# Patient Record
Sex: Male | Born: 1990 | Race: Black or African American | Hispanic: No | Marital: Single | Smoking: Current every day smoker
Health system: Southern US, Community
[De-identification: ages and names within clinical notes are randomized; demographics above are authoritative.]

## PROBLEM LIST (undated history)

## (undated) DIAGNOSIS — Z789 Other specified health status: Secondary | ICD-10-CM

## (undated) HISTORY — PX: OTHER SURGICAL HISTORY: SHX169

## (undated) HISTORY — DX: Other specified health status: Z78.9

---
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
## 2011-11-06 ENCOUNTER — Emergency Department (INDEPENDENT_AMBULATORY_CARE_PROVIDER_SITE_OTHER)
Admission: EM | Admit: 2011-11-06 | Discharge: 2011-11-06 | Disposition: A | Discharge status: Home / Self Care | Payer: Self-pay | Source: Home / Self Care

## 2011-11-06 ENCOUNTER — Encounter (HOSPITAL_COMMUNITY): Payer: Self-pay

## 2011-11-06 DIAGNOSIS — Z202 Contact with and (suspected) exposure to infections with a predominantly sexual mode of transmission: Secondary | ICD-10-CM

## 2011-11-06 DIAGNOSIS — Z2089 Contact with and (suspected) exposure to other communicable diseases: Secondary | ICD-10-CM

## 2011-11-06 MED ORDER — AZITHROMYCIN 250 MG PO TABS
1000.0000 mg | ORAL_TABLET | Freq: Every day | ORAL | Status: DC
  Administered 2011-11-06 (×2): 1000 mg via ORAL

## 2011-11-06 MED ORDER — AZITHROMYCIN 250 MG PO TABS
ORAL_TABLET | ORAL | Status: AC
  Filled 2011-11-06: qty 4

## 2011-11-06 NOTE — ED Provider Notes (Signed)
History     CSN: [id number]  Arrival date & time 11/06/11  1107   None     Chief Complaint  Patient presents with  . Exposure to STD    (Consider location/radiation/quality/duration/timing/severity/associated sxs/prior treatment) HPI Comments: Was told by sexual partner that she had been tested positive for chlamydia. He is here for testing and treatment. This otherwise healthy male  has no associated complaints. Denies urinary or other GU sx's.   The history is provided by the patient.    History reviewed. No pertinent past medical history.  History reviewed. No pertinent past surgical history.  History reviewed. No pertinent family history.  History  Substance Use Topics  . Smoking status: Current Everyday Smoker  . Smokeless tobacco: Not on file  . Alcohol Use: Yes      Review of Systems  Constitutional: Positive for activity change. Negative for chills.  Respiratory: Negative for cough and shortness of breath.   Genitourinary: Negative.   Musculoskeletal: Negative.   Skin: Negative.     Allergies  Review of patient's allergies indicates no known allergies.  Home Medications  No current outpatient prescriptions on file.  BP 134/78  Pulse 74  Temp 99.1 F (37.3 C) (Oral)  Resp 16  SpO2 100%  Physical Exam  Constitutional: He is oriented to person, place, and time. He appears well-developed and well-nourished.  Pulmonary/Chest: Effort normal. No respiratory distress.  Genitourinary: Penis normal. No penile tenderness.  Neurological: He is alert and oriented to person, place, and time.  Skin: Skin is warm and dry.  Psychiatric: He has a normal mood and affect.    ED Course  Procedures (including critical care time)   Labs Reviewed  GC/CHLAMYDIA PROBE AMP, GENITAL   No results found.   1. Exposure to STD       MDM  Asymptomatic male with chlamydia exposurant tx with azithro 1gm po        Hayden Rasmussen, NP 11/06/11 1312  Hayden Rasmussen, NP 11/06/11 1313  Hayden Rasmussen, NP 11/06/11 1316

## 2011-11-06 NOTE — ED Notes (Signed)
States his pregnant girlfriend was told she has chlamydia; here for testing and treatment (she reportedly has a RX called in from provider)

## 2011-11-08 NOTE — ED Provider Notes (Signed)
Medical screening examination/treatment/procedure(s) were performed by non-physician practitioner and as supervising physician I was immediately available for consultation/collaboration.  Brya Simerly   Aeriana Speece, MD 11/08/11 1605 

## 2011-11-10 LAB — GC/CHLAMYDIA PROBE AMP, GENITAL
Chlamydia, DNA Probe: POSITIVE — AB
GC Probe Amp, Genital: NEGATIVE

## 2011-11-18 ENCOUNTER — Telehealth (HOSPITAL_COMMUNITY): Payer: Self-pay | Admitting: *Deleted

## 2011-11-18 NOTE — ED Notes (Signed)
GC neg., Chlamydia pos. Pt. adequately treated with Zithromax. I called pt.  Pt. verified x 2 and given results.  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  Pt. instructed to notify their partner, no sex for 1 week and to practice safe sex. Pt. told he can get HIV testing at the [facility]. STD clinic by appointment.  DHHS form completed and faxed to the [facility]. Vassie Moselle 11/18/2011

## 2013-07-08 ENCOUNTER — Emergency Department (HOSPITAL_COMMUNITY)
Admission: EM | Admit: 2013-07-08 | Discharge: 2013-07-08 | Disposition: A | Discharge status: Home / Self Care | Payer: Self-pay | Attending: Emergency Medicine | Admitting: Emergency Medicine

## 2013-07-08 ENCOUNTER — Encounter (HOSPITAL_COMMUNITY): Payer: Self-pay | Admitting: Emergency Medicine

## 2013-07-08 DIAGNOSIS — F17200 Nicotine dependence, unspecified, uncomplicated: Secondary | ICD-10-CM | POA: Insufficient documentation

## 2013-07-08 DIAGNOSIS — K029 Dental caries, unspecified: Secondary | ICD-10-CM | POA: Insufficient documentation

## 2013-07-08 DIAGNOSIS — K089 Disorder of teeth and supporting structures, unspecified: Secondary | ICD-10-CM | POA: Insufficient documentation

## 2013-07-08 MED ORDER — AMOXICILLIN 500 MG PO CAPS
500.0000 mg | ORAL_CAPSULE | Freq: Once | ORAL | Status: AC
  Administered 2013-07-08: 500 mg via ORAL
  Filled 2013-07-08: qty 1

## 2013-07-08 MED ORDER — AMOXICILLIN 500 MG PO CAPS
500.0000 mg | ORAL_CAPSULE | Freq: Three times a day (TID) | ORAL | Status: DC
Start: 2013-07-08 — End: 2019-04-14

## 2013-07-08 MED ORDER — HYDROCODONE-ACETAMINOPHEN 5-325 MG PO TABS
1.0000 | ORAL_TABLET | Freq: Once | ORAL | Status: AC
  Administered 2013-07-08: 1 via ORAL
  Filled 2013-07-08: qty 1

## 2013-07-08 MED ORDER — HYDROCODONE-ACETAMINOPHEN 5-325 MG PO TABS: ORAL_TABLET | ORAL | Status: DC

## 2013-07-08 NOTE — Discharge Instructions (Signed)
Take vicodin for breakthrough pain, do not drink alcohol, drive, care for children or do other critical tasks while taking vicodin. ° °Return to the emergency room for fever, change in vision, redness to the face that rapidly spreads towards the eye, nausea or vomiting, difficulty swallowing or shortness of breath. °  °Apply warm compresses to jaw throughout the day.  ° °Take your antibiotics as directed and to the end of the course.  ° °Followup with a dentist is very important for ongoing evaluation and management of recurrent dental pain. Return to emergency department for emergent changing or worsening symptoms." ° °Low-cost dental clinic: °**David  Civils  at [phone]**  °**Janna Civils at [phone] [address]**   ° °You may also call [phone] ° °Dental Assistance °If the dentist on-call cannot see you, please use the resources below: ° ° °Patients with Medicaid: [facility] °[address], [phone] °[address], [phone] ° °If unable to pay, or uninsured, contact [facility] ([phone]) or [facility] ([phone] in , [phone] in [place]) to become qualified for the adult dental clinic ° °Other Low-Cost Community Dental Services: °[facility]- [address], [place], [place], [address] °   [phone], Ext. [phone] °   2nd and 4th Thursday of the month at 6:30am °   10 clients each day by appointment, can sometimes see walk-in     patients if someone does not show for an appointment °[facility]- [address], [place], [place], [address] °   [phone] °[facility]- [address], [place], [place], [address] °   [phone] ° °[facility]- [phone] °[facility]- [phone] °[facility]- [phone] ° °

## 2013-07-08 NOTE — ED Provider Notes (Signed)
CSN: [id number]     Arrival date & time 07/08/13  1640 History  This chart was scribed for non-physician practitioner, Wynetta EmeryNicole Andreka Stucki, PA-C, working with Gwyneth SproutWhitney Plunkett, MD by Shari HeritageAisha Amuda, ED Scribe. This patient was seen in room TR08C/TR08C and the patient's care was started at 5:41 PM.   Chief Complaint  Patient presents with  . Dental Pain    The history is provided by the patient. No language interpreter was used.    HPI Comments: William Porter is a 23 y.o. male who presents to the Emergency Department complaining of intermittent, throbbing left lower dental pain that radiates to the rest of his left face onset 3 days ago. He also reports that one of his right lower back teeth fractured while eating today. Patient denies associated shortness of breath, dysphagia, or fever. He has taken ibuprofen and BC Goody Powder without relief. He does not have a dentist. Patient has no chronic medical conditions. He is a current smoker.  History reviewed. No pertinent past medical history. History reviewed. No pertinent past surgical history. No family history on file. History  Substance Use Topics  . Smoking status: Current Every Day Smoker  . Smokeless tobacco: Not on file  . Alcohol Use: Yes    Review of Systems  Constitutional: Negative for fever.  HENT: Positive for dental problem. Negative for trouble swallowing.   Respiratory: Negative for shortness of breath.   Cardiovascular: Negative for chest pain.  Gastrointestinal: Negative for nausea, vomiting, abdominal pain and diarrhea.  All other systems reviewed and are negative.   Allergies  Review of patient's allergies indicates no known allergies.  Home Medications   Prior to Admission medications   Not on File   Triage Vitals: BP 156/109  Pulse 62  Temp(Src) 98.7 F (37.1 C) (Oral)  Resp 18  Ht 6\' 2"  (1.88 m)  Wt 211 lb 3 oz (95.794 kg)  BMI 27.10 kg/m2  SpO2 100% Physical Exam  Nursing note and vitals  reviewed. Constitutional: He is oriented to person, place, and time. He appears well-developed and well-nourished. No distress.  HENT:  Head: Normocephalic.  Mouth/Throat: Dental caries present. No dental abscesses.    No tenderness to palpation underneath tongue or with tongue elevation. No gross dental abscess.   Eyes: Conjunctivae and EOM are normal.  Cardiovascular: Normal rate.   Pulmonary/Chest: Effort normal. No stridor.  Musculoskeletal: Normal range of motion.  Neurological: He is alert and oriented to person, place, and time.  Psychiatric: He has a normal mood and affect.    ED Course  Procedures (including critical care time) DIAGNOSTIC STUDIES: Oxygen Saturation is 100% on room air, normal by my interpretation.    COORDINATION OF CARE: 5:43 PM- Patient with toothache. No gross abscess. Exam unconcerning for Ludwig's angina or spread of infection. Will treat with antibiotics and pain medicine. Urged patient to follow-up with dentist. Patient informed of current plan for treatment and evaluation and agrees with plan at this time.    MDM   Final diagnoses:  Pain due to dental caries    Filed Vitals:   07/08/13 1647  BP: 156/109  Pulse: 62  Temp: 98.7 F (37.1 C)  TempSrc: Oral  Resp: 18  Height: 6\' 2"  (1.88 m)  Weight: 211 lb 3 oz (95.794 kg)  SpO2: 100%    Medications  amoxicillin (AMOXIL) capsule 500 mg (500 mg Oral Given 07/08/13 1800)  HYDROcodone-acetaminophen (NORCO/VICODIN) 5-325 MG per tablet 1 tablet (1 tablet Oral Given 07/08/13 1759)  William Porter is a 23 y.o. male presenting with dental pain associated with dental cary but no signs or symptoms of dental abscess. Patient afebrile, non toxic appearing and swallowing secretions well. I gave patient referral to dentist and stressed the importance of dental follow up for ultimate management of dental pain. Patient voices understanding and is agreeable to plan.  Evaluation does not show  pathology that would require ongoing emergent intervention or inpatient treatment. Pt is hemodynamically stable and mentating appropriately. Discussed findings and plan with patient/guardian, who agrees with care plan. All questions answered. Return precautions discussed and outpatient follow up given.   Discharge Medication List as of 07/08/2013  5:49 PM    START taking these medications   Details  amoxicillin (AMOXIL) 500 MG capsule Take 1 capsule (500 mg total) by mouth 3 (three) times daily., Starting 07/08/2013, Until Discontinued, Print    HYDROcodone-acetaminophen (NORCO/VICODIN) 5-325 MG per tablet Take 1-2 tablets by mouth every 6 hours as needed for pain., Print        Note: Portions of this report may have been transcribed using voice recognition software. Every effort was made to ensure accuracy; however, inadvertent computerized transcription errors may be present   I personally performed the services described in this documentation, which was scribed in my presence. The recorded information has been reviewed and is accurate.   Wynetta Emeryicole Zehava Turski, PA-C 07/09/13 [phone]

## 2013-07-08 NOTE — ED Notes (Signed)
Pt c/o left lower sided toothache ongoing since 2006. Pain increased 2-3 days ago. Pt has tried advil, ibuprofen, BC goody powder without relief.

## 2013-07-09 NOTE — ED Provider Notes (Signed)
Medical screening examination/treatment/procedure(s) were performed by non-physician practitioner and as supervising physician I was immediately available for consultation/collaboration.   EKG Interpretation None        Arneshia Ade, MD 07/09/13 2327 

## 2013-10-24 ENCOUNTER — Encounter (HOSPITAL_COMMUNITY): Payer: Self-pay | Admitting: Emergency Medicine

## 2013-10-24 ENCOUNTER — Emergency Department (HOSPITAL_COMMUNITY)
Admission: EM | Admit: 2013-10-24 | Discharge: 2013-10-24 | Disposition: A | Discharge status: Home / Self Care | Payer: Self-pay | Attending: Emergency Medicine | Admitting: Emergency Medicine

## 2013-10-24 DIAGNOSIS — K029 Dental caries, unspecified: Secondary | ICD-10-CM | POA: Insufficient documentation

## 2013-10-24 DIAGNOSIS — K089 Disorder of teeth and supporting structures, unspecified: Secondary | ICD-10-CM | POA: Insufficient documentation

## 2013-10-24 DIAGNOSIS — F17200 Nicotine dependence, unspecified, uncomplicated: Secondary | ICD-10-CM | POA: Insufficient documentation

## 2013-10-24 DIAGNOSIS — Z792 Long term (current) use of antibiotics: Secondary | ICD-10-CM | POA: Insufficient documentation

## 2013-10-24 MED ORDER — PENICILLIN V POTASSIUM 500 MG PO TABS: 500.0000 mg | ORAL_TABLET | Freq: Three times a day (TID) | ORAL | Status: DC

## 2013-10-24 MED ORDER — HYDROCODONE-ACETAMINOPHEN 5-325 MG PO TABS: 1.0000 | ORAL_TABLET | Freq: Four times a day (QID) | ORAL | Status: DC | PRN

## 2013-10-24 NOTE — ED Provider Notes (Signed)
CSN: [id number]     Arrival date & time 10/24/13  1747 History  This chart was scribed for non-physician practitioner, Felicie Mornavid Esdras Delair, NP working with Richardean Canalavid H Yao, MD by Greggory StallionKayla Andersen, ED scribe. This patient was seen in room TR07C/TR07C and the patient's care was started at 6:39 PM.   Chief Complaint  Patient presents with  . Dental Pain   The history is provided by the patient. No language interpreter was used.   HPI Comments: William Porter is a 23 y.o. male who presents to the Emergency Department complaining of sudden onset right lower dental pain that started earlier today. States his tooth has been fractured for a few weeks but when he was eating a chip earlier, it caused the tooth to move. Closing his mouth worsens the pain.   History reviewed. No pertinent past medical history. History reviewed. No pertinent past surgical history. History reviewed. No pertinent family history. History  Substance Use Topics  . Smoking status: Current Every Day Smoker  . Smokeless tobacco: Not on file  . Alcohol Use: Yes    Review of Systems  HENT: Positive for dental problem.   All other systems reviewed and are negative.  Allergies  Review of patient's allergies indicates no known allergies.  Home Medications   Prior to Admission medications   Medication Sig Start Date End Date Taking? Authorizing Provider  amoxicillin (AMOXIL) 500 MG capsule Take 1 capsule (500 mg total) by mouth 3 (three) times daily. 07/08/13   Nicole Pisciotta, PA-C  HYDROcodone-acetaminophen (NORCO/VICODIN) 5-325 MG per tablet Take 1-2 tablets by mouth every 6 hours as needed for pain. 07/08/13   Nicole Pisciotta, PA-C   BP 126/41  Pulse 66  Temp(Src) 98.4 F (36.9 C) (Oral)  Resp 20  Ht 6\' 2"  (1.88 m)  Wt 210 lb (95.255 kg)  BMI 26.95 kg/m2  SpO2 100%  Physical Exam  Nursing note and vitals reviewed. Constitutional: He is oriented to person, place, and time. He appears well-developed and well-nourished.  No distress.  HENT:  Head: Normocephalic and atraumatic.  Fragment of tooth #29 barely attached. Fractured.   Eyes: Conjunctivae and EOM are normal.  Neck: Neck supple. No tracheal deviation present.  Cardiovascular: Normal rate, regular rhythm and normal heart sounds.   Pulmonary/Chest: Effort normal and breath sounds normal. No respiratory distress. He has no wheezes. He has no rhonchi. He has no rales.  Musculoskeletal: Normal range of motion.  Neurological: He is alert and oriented to person, place, and time.  Skin: Skin is warm and dry.  Psychiatric: He has a normal mood and affect. His behavior is normal.    ED Course  Procedures (including critical care time)  DIAGNOSTIC STUDIES: Oxygen Saturation is 100% on RA, normal by my interpretation.    COORDINATION OF CARE: 6:42 PM-Discussed treatment plan which includes an antibiotic, an anti-inflammatory and pain medication with pt at bedside and pt agreed to plan. Will give pt dental referrals and advised him to follow up.   Labs Review Labs Reviewed - No data to display  Imaging Review No results found.   EKG Interpretation None      MDM   Final diagnoses:  None    Dental pain, fractured tooth..  Antibiotic, analgesia, dental resource information provided.  I personally performed the services described in this documentation, which was scribed in my presence. The recorded information has been reviewed and is accurate.  Jimmye Normanavid John Wai Minotti, NP 10/25/13 [phone]

## 2013-10-24 NOTE — Discharge Instructions (Signed)
Dental Pain °A tooth ache may be caused by cavities (tooth decay). Cavities expose the nerve of the tooth to air and hot or cold temperatures. It may come from an infection or abscess (also called a boil or furuncle) around your tooth. It is also often caused by dental caries (tooth decay). This causes the pain you are having. °DIAGNOSIS  °Your caregiver can diagnose this problem by exam. °TREATMENT  °· If caused by an infection, it may be treated with medications which kill germs (antibiotics) and pain medications as prescribed by your caregiver. Take medications as directed. °· Only take over-the-counter or prescription medicines for pain, discomfort, or fever as directed by your caregiver. °· Whether the tooth ache today is caused by infection or dental disease, you should see your dentist as soon as possible for further care. °SEEK MEDICAL CARE IF: °The exam and treatment you received today has been provided on an emergency basis only. This is not a substitute for complete medical or dental care. If your problem worsens or new problems (symptoms) appear, and you are unable to meet with your dentist, call or return to this location. °SEEK IMMEDIATE MEDICAL CARE IF:  °· You have a fever. °· You develop redness and swelling of your face, jaw, or neck. °· You are unable to open your mouth. °· You have severe pain uncontrolled by pain medicine. °MAKE SURE YOU:  °· Understand these instructions. °· Will watch your condition. °· Will get help right away if you are not doing well or get worse. °Document Released: 02/23/2005 Document Revised: 05/18/2011 Document Reviewed: 10/12/2007 °ExitCare® Patient Information ©2015 [employer]. This information is not intended to replace advice given to you by your health care provider. Make sure you discuss any questions you have with your health care provider. ° °Emergency Department Resource Guide °1) Find a Doctor and Pay Out of Pocket °Although you won't have to find out who  is covered by your insurance plan, it is a good idea to ask around and get recommendations. You will then need to call the office and see if the doctor you have chosen will accept you as a new patient and what types of options they offer for patients who are self-pay. Some doctors offer discounts or will set up payment plans for their patients who do not have insurance, but you will need to ask so you aren't surprised when you get to your appointment. ° °2) Contact Your Local Health Department °Not all health departments have doctors that can see patients for sick visits, but many do, so it is worth a call to see if yours does. If you don't know where your local health department is, you can check in your phone book. The CDC also has a tool to help you locate your state's health department, and many state websites also have listings of all of their local health departments. ° °3) Find a Walk-in Clinic °If your illness is not likely to be very severe or complicated, you may want to try a walk in clinic. These are popping up all over the country in pharmacies, drugstores, and shopping centers. They're usually staffed by nurse practitioners or physician assistants that have been trained to treat common illnesses and complaints. They're usually fairly quick and inexpensive. However, if you have serious medical issues or chronic medical problems, these are probably not your best option. ° °No Primary Care Doctor: °- Call [facility] at  832-8000 - they can help you locate a primary   care doctor that  accepts your insurance, provides certain services, etc. °- Physician Referral Service- 1-800-533-3463 ° °Chronic Pain Problems: °Organization         Address  Phone   Notes  °[facility]  (336) 297-2271 Patients need to be referred by their primary care doctor.  ° °Medication Assistance: °Organization         Address  Phone   Notes  °[place] [facility] [address]  [address] °[place], [place] [address] (336) 641-8030 --Must be a resident of [place] °-- Must have NO insurance coverage whatsoever (no Medicaid/ Medicare, etc.) °-- The pt. MUST have a primary care doctor that directs their care regularly and follows them in the community °  °[facility]  (866) 331-1348   °[employer]  (888) 892-1162   ° °Agencies that provide inexpensive medical care: °Organization         Address  Phone   Notes  °[facility]  (336) 832-8035   °[facility]    (336) 832-7272   °[facility] [address] °[place], [place] [address] (336) 832-4777   °[facility] [address], °[place] (336) 271-4999   °[facility]    (336) 373-0678   °[facility]    (336) 272-1050   °[facility] ° [address], [place] Phone:  (336) 832-4444, Fax:  (336) 832-4440 Hours of Operation:  9 am - 6 pm, M-F.  Also accepts Medicaid/Medicare and self-pay.  °[facility] ° [address], [place] Phone: (336) 832-3150, Fax: (336) 832-3151. Hours of Operation:  8:30 am - 5:30 pm, M-F.  Also accepts Medicaid and self-pay.  °[facility] [place] [address], [place] Phone: (336) 878-6027   °[facility] [address], [place], [place] (336)723-1848, Ext. 123 Mondays & Thursdays: 7-9 AM.  First 15 patients are seen on a first come, first serve basis. °  ° °Medicaid-accepting [place] Providers: ° °Organization         Address  Phone   Notes  °[facility] [address], [place] (336) 641-2100 Also accepts self-pay patients.  °[facility] [address], [place] ° (336) 856-9996   °[facility] [address], [place] (336) 288-8857   °[facility] [address] [place] [address], [place] (336) 299-7000   °Veita Bland [address], [place]  ° (336) 373-1557 Only accepts [place] Access Medicaid patients after they have their name applied to their card.  ° °Self-Pay (no insurance) in [place]: ° °Organization         Address  Phone   Notes  °Sickle Cell Patients, [facility] [address], [place] (336) 832-1970   °[facility] [address], [place] (336) 832-4400   °[facility] ° [address],  (336) 992-4800   °[facility]/Dr. Osei-Bonsu ° [address] [place] [address], [place] or [address], [place] (336) 841-8500 Phone number for both [place] and [place] locations is the same.  °[facility] [address], [place] (336) 299-0000   °[facility] [address] [place] [address], [place] or [address] (336) 852-7530 °(336) 878-2260   °  [facility] [address], [place] (336) 350-1642, phone; (336) 294-5005, fax Sees patients 1st and 3rd Saturday of every month.  Must not qualify for public or private insurance (i.e. Medicaid, Medicare, [place] Health Choice, Veterans' Benefits) • Household income should be no more than 200% of the poverty level •The clinic cannot treat you if you are pregnant or think you are pregnant • Sexually transmitted diseases are not treated at the clinic.  ° ° °Dental Care: °Organization         Address  Phone  Notes  °[facility] [facility] [address], [place] (336) 641-6152 Accepts children up to age 21 who are enrolled in Medicaid or [place] Health Choice; pregnant women with a Medicaid card; and children who have applied for Medicaid or [place] Health Choice, but were declined, whose parents can pay a reduced fee at time of service.  °[facility] [place]  [address], [place] (336) 641-7733 Accepts children up to age 21 who are  enrolled in Medicaid or [place] Health Choice; pregnant women with a Medicaid card; and children who have applied for Medicaid or [place] Health Choice, but were declined, whose parents can pay a reduced fee at time of service.  °[facility] ° [address], [place] (336) 641-4533 Patients are seen by appointment only. Walk-ins are not accepted. [facility] will see patients 18 years of age and older. °Monday - Tuesday (8am-5pm) °Most Wednesdays (8:30-5pm) °$30 per visit, cash only  °[facility] ° [address], [place] (336) 641-4533 Patients are seen by appointment only. Walk-ins are not accepted. [facility] will see patients 18 years of age and older. °One Wednesday Evening (Monthly: Volunteer Based).  $30 per visit, cash only  °[facility]  (919) 537-3737 for adults; Children under age 4, call Graduate Pediatric Dentistry at ([phone]) [phone]. Children aged 4-14, please call ([phone]) [phone] to request a pediatric application. ° Dental services are provided in all areas of dental care including fillings, crowns and bridges, complete and partial dentures, implants, gum treatment, root canals, and extractions. Preventive care is also provided. Treatment is provided to both adults and children. °Patients are selected via a lottery and there is often a waiting list. °  °[facility] [address], °[place] ° (336) 763-8833 [email or web address] °  °[facility] [address], [place], [place] (336)723-1848, Ext. 123 Second and Fourth Thursday of each month, opens at 6:30 AM; Clinic ends at 9 AM.  Patients are seen on a first-come first-served basis, and a limited number are seen during each clinic.  ° °[facility] ° [address], [place], [place] (336) 723-7904   Eligibility Requirements °You must have lived in [place], [place], or [place] counties for at least the last three months. °  You  cannot be eligible for state or federal sponsored healthcare insurance, including Veterans Administration, Medicaid, or Medicare. °  You generally cannot be eligible for healthcare insurance through your employer.  °  How to apply: °Eligibility screenings are held every Tuesday and Wednesday afternoon from 1:00 pm until 4:00 pm. You do not need an appointment for the interview!  °[facility] [address], [place], [place] 336-631-2330   °[facility] [facility]  336-342-8273   °[facility]  336-703-3100   °[facility]  336-570-6415   ° °Behavioral Health Resources in the Community: °Intensive Outpatient Programs °Organization         Address  Phone  Notes  °[facility] [address], [place], [place] 336-878-6098   °[facility] [address], [place], [place] 336-832-9800   °[facility] [address], [place], [place] ° 336-882-2125   °[facility] [address],  °[place], [place] 1-800-853-5163 or 336-641-4981   °Substance Abuse Resources °Organization         Address  Phone  Notes  °[facility]  336-882-2125   °[facility]  336-784-9470   °[facility]  336-285-9073   °[facility]  336-845-3988   °Residential & Outpatient Substance Abuse Program  1-800-659-3381   °Psychological Services °Organization         Address  Phone  Notes  °[facility]  336- 832-9600   °[facility]  336- 378-7881   °[facility] [address], [place] 1-800-853-5163 or 336-641-4981   ° °Mobile Crisis Teams °Organization         Address  Phone  Notes  °[facility], Mobile Crisis Care Unit  1-877-626-1772   °Assertive °[facility] ° [address] [place], [place] 336-834-9664   °Sharon DeEsch [address] °[place] 336-554-5454   ° °Self-Help/Support  Groups °Organization         Address  Phone             Notes  °[facility] - variety of support groups  336- 373-1402 Call for more information  °Narcotics Anonymous (NA), [facility] [address], °[place]  2 meetings at this location  ° °Residential Treatment Programs °Organization         Address  Phone  Notes  °[facility] [address],    °[place]  1-866-801-8205   °[facility] ° [address], [place], [place] 704-293-8524   °[facility] [address], [place] 336-845-3988 Admissions: 8am-3pm M-F  °[facility] [address]    °[place], [place] 336-841-1104   °[facility] [address], [place], [place] 336-379-7146   °[facility] [address]  °[place], [place] 336-285-9073   °[facility] - Intensive Outpatient [address], [place], [place] 336-852-3033   °[facility] [address]  °[place], [place] 1-877-615-2722 or 336-784-9470   °[facility] [address] [place], [place] 336-227-7417 Accepts Medicaid  °[facility] [address]  ° [place] 1-800-659-3381 Substance Abuse/Addiction Treatment  ° °[place] Behavioral Health Resources °Organization         Address  Phone  Notes  °[facility]  (888) 581-9988   °Julie Brannon, PhD [address] [place], [place]   (336) 349-5553 or (336) 951-0000   °[facility]   [address] °[place], [place] (336) 349-4454   °[facility] [address], [place], [place] (336) 342-8316 Insurance/Medicaid/sponsorship through [facility]  °[facility] [address]                                    [place],  (336) 342-8316 Therapy/tele-psych/case  °[facility]   [address]  ° [place], [place] (336) 349-2233    °Dr. Arfeen  (336) 349-4544   °[facility] [place]  [employer] [place] [facility] 1) [address], [place] °2) [address], [place] °3)  [address], [place] (336) 349-3220 °(336) 342-7768 ° °(336) 342-8140   °[place] [facility] (336) 342-1394 or (336) 342-3537 (After Hours)    ° ° ° °

## 2013-10-24 NOTE — ED Notes (Signed)
Pt in c/o right lower toothache that started after eating a chip, states the tooth ws already cracked and the chip moved it

## 2013-10-24 NOTE — ED Notes (Signed)
Patient refused wheelchair. 

## 2013-10-25 NOTE — ED Provider Notes (Signed)
Medical screening examination/treatment/procedure(s) were performed by non-physician practitioner and as supervising physician I was immediately available for consultation/collaboration.   EKG Interpretation None        Richardean Canalavid H Yao, MD 10/25/13 1048

## 2014-03-31 ENCOUNTER — Encounter (HOSPITAL_COMMUNITY): Payer: Self-pay | Admitting: Emergency Medicine

## 2014-03-31 ENCOUNTER — Emergency Department (HOSPITAL_COMMUNITY)
Admission: EM | Admit: 2014-03-31 | Discharge: 2014-03-31 | Disposition: A | Discharge status: Home / Self Care | Payer: Self-pay | Attending: Emergency Medicine | Admitting: Emergency Medicine

## 2014-03-31 DIAGNOSIS — K0889 Other specified disorders of teeth and supporting structures: Secondary | ICD-10-CM

## 2014-03-31 DIAGNOSIS — Z720 Tobacco use: Secondary | ICD-10-CM | POA: Insufficient documentation

## 2014-03-31 DIAGNOSIS — K002 Abnormalities of size and form of teeth: Secondary | ICD-10-CM | POA: Insufficient documentation

## 2014-03-31 DIAGNOSIS — K047 Periapical abscess without sinus: Secondary | ICD-10-CM | POA: Insufficient documentation

## 2014-03-31 DIAGNOSIS — Z792 Long term (current) use of antibiotics: Secondary | ICD-10-CM | POA: Insufficient documentation

## 2014-03-31 DIAGNOSIS — K029 Dental caries, unspecified: Secondary | ICD-10-CM | POA: Insufficient documentation

## 2014-03-31 MED ORDER — HYDROCODONE-ACETAMINOPHEN 5-325 MG PO TABS: 1.0000 | ORAL_TABLET | ORAL | Status: DC | PRN

## 2014-03-31 MED ORDER — PENICILLIN V POTASSIUM 500 MG PO TABS: 500.0000 mg | ORAL_TABLET | Freq: Four times a day (QID) | ORAL | Status: DC

## 2014-03-31 MED ORDER — HYDROCODONE-ACETAMINOPHEN 5-325 MG PO TABS
1.0000 | ORAL_TABLET | Freq: Once | ORAL | Status: AC
  Administered 2014-03-31: 1 via ORAL
  Filled 2014-03-31: qty 1

## 2014-03-31 NOTE — ED Notes (Signed)
Pt. reports worsening right lower molar pain / swelling for several days unrelieved by OTC pain medications .

## 2014-03-31 NOTE — Discharge Instructions (Signed)
Take the prescribed medication as directed. °Follow-up with dentist. °Return to the ED for new or worsening symptoms. ° ° °Emergency Department Resource Guide °1) Find a Doctor and Pay Out of Pocket °Although you won't have to find out who is covered by your insurance plan, it is a good idea to ask around and get recommendations. You will then need to call the office and see if the doctor you have chosen will accept you as a new patient and what types of options they offer for patients who are self-pay. Some doctors offer discounts or will set up payment plans for their patients who do not have insurance, but you will need to ask so you aren't surprised when you get to your appointment. ° °2) Contact Your Local Health Department °Not all health departments have doctors that can see patients for sick visits, but many do, so it is worth a call to see if yours does. If you don't know where your local health department is, you can check in your phone book. The CDC also has a tool to help you locate your state's health department, and many state websites also have listings of all of their local health departments. ° °3) Find a Walk-in Clinic °If your illness is not likely to be very severe or complicated, you may want to try a walk in clinic. These are popping up all over the country in pharmacies, drugstores, and shopping centers. They're usually staffed by nurse practitioners or physician assistants that have been trained to treat common illnesses and complaints. They're usually fairly quick and inexpensive. However, if you have serious medical issues or chronic medical problems, these are probably not your best option. ° °No Primary Care Doctor: °- Call Health Connect at  832-8000 - they can help you locate a primary care doctor that  accepts your insurance, provides certain services, etc. °- Physician Referral Service- 1-800-533-3463 ° °Chronic Pain Problems: °Organization         Address  Phone   Notes  °[facility]  ([phone]) [phone] Patients need to be referred by their primary care doctor.  ° °Medication Assistance: °Organization         Address  Phone   Notes  °[place] [facility] [address] °[place], [place] [address] (336) 641-8030 --Must be a resident of [place] °-- Must have NO insurance coverage whatsoever (no Medicaid/ Medicare, etc.) °-- The pt. MUST have a primary care doctor that directs their care regularly and follows them in the community °  °[facility]  (866) 331-1348   °United Way  (888) 892-1162   ° °Agencies that provide inexpensive medical care: °Organization         Address  Phone   Notes  °[facility]  (336) 832-8035   °[facility]    (336) 832-7272   °[facility] [address] °[place], [place] [address] (336) 832-4777   °[facility] [address], °[place] (336) 271-4999   °[facility]    (336) 373-0678   °[facility]    (336) 272-1050   °[facility] ° [address], [place] Phone:  (336) 832-4444, Fax:  (336) 832-4440 Hours of Operation:  9 am - 6 pm, M-F.  Also accepts Medicaid/Medicare and self-pay.  °[facility] ° [address], [place] Phone: (336) 832-3150, Fax: (336) [phone]. Hours   of Operation:  8:30 am - 5:30 pm, M-F.  Also accepts Medicaid and self-pay.  [facility] [place] [address], [place] Phone: [phone]   [place], [place], [place] ([phone], Ext. [phone] Mondays & Thursdays: 7-9 AM.  First 15 patients are seen on a first come, first serve basis.    Ewa Gentry Providers:  Organization         Address  Phone   Notes  [facility] [address], [place] [phone] Also accepts self-pay patients.  [facility] [address], [place]  ([phone]   [place], [address], [place] [phone]   [facility] [address], [place] [phone]   Lucianne Lei [address], [place]   ([phone] Only accepts [place] Access [place] patients after they have their name applied to their card.   Self-Pay (no insurance) in [facility]:  Organization         Address  Phone   Notes  Sickle Cell Patients, [facility] [place] [phone]   [facility] [place] [phone]   [facility] [place]  [place], [place], [place] [phone]   [facility]/Dr. Osei-Bonsu  [address], [place] or [address], [place] [phone] Phone number for both [place] and [place] locations is the same.  [facility] [address] [place] [phone]   [facility] [address] [place] or [address] [phone]   [facility] [address], [place] ([phone], phone; [phone], fax Sees patients 1st and 3rd Saturday of every month.  Must not qualify for public or private insurance (i.e. Medicaid, Medicare, [place] Health Choice, Veterans' Benefits)  Household income should be no more than 200% of the poverty level The clinic cannot treat you if you are pregnant or think you are pregnant  Sexually transmitted diseases are not treated at the clinic.    Dental Care: Organization         Address  Phone  Notes  [facility] Department of [facility] [phone] Accepts children up to age 25 who are enrolled in [place] or [place]; pregnant women with a Medicaid card; and children who have applied for Medicaid  or [place] Health Choice, but were declined, whose parents can pay a reduced fee at time of service.  [facility] [place] [facility] of [facility]  [address], [place] [phone] Accepts children up to age 71 who are enrolled in [place] or [place]; pregnant women with a Medicaid card; and children who have applied for Medicaid or [place] Health Choice, but were declined, whose parents can pay a reduced fee at time of service.  [facility]  [place] [phone] Patients are seen by appointment only. Walk-ins are not accepted. [place] will see patients 52 years of age and older. Monday - Tuesday (8am-5pm) Most Wednesdays (8:30-5pm) $30 per visit, cash only  [facility]  [address], [place] [phone] Patients are  seen by appointment only. Walk-ins are not accepted. [place] will see patients 30 years of age and older. One Wednesday Evening (Monthly: Volunteer Based).  $30 per visit, cash only  [place]  [phone] for adults; Children under age 61, call Graduate Pediatric Dentistry at [phone]. Children aged 81-14, please call [phone] to request a pediatric application.  Dental services are provided in all areas of dental care including fillings, crowns and bridges, complete and partial dentures, implants, gum treatment, root canals, and extractions. Preventive care is also provided. Treatment is provided to both adults and children. Patients are selected via a lottery and there is often a waiting list.   [facility] [address] [place]  ([phone] [email or web address]   [facility] [address] [place], [place] ([phone], Ext. [phone] Second and Fourth Thursday of each month, opens at 6:30 AM; Clinic ends at 9 AM.  Patients are seen on a first-come first-served basis, and a limited number are seen  during each clinic.   [facility]  [address] Hillard Danker [place], [place] [phone]   Eligibility Requirements You must have lived in [place], [place], or [place] counties for at least the last three months.   You cannot be eligible for state or federal sponsored [employer], including [employer], [place], or [employer].   You generally cannot be eligible for healthcare insurance through your employer.    How to apply: Eligibility screenings are held every Tuesday and Wednesday afternoon from 1:00 pm until 4:00 pm. You do not need an appointment for the interview!  [facility] [address] [place], [place]   [place]  [place] Department  [place]  [phone]    Behavioral Health Resources in the Community: Intensive Outpatient Programs Organization         Address  Phone  Notes  [place]. [address], [place], [place] [phone]   [facility] [address], [place], [place]   [facility] [address] [place], [place]   [place] [address]  [place], [place] or [phone]   Substance Abuse Resources Organization         Address  Phone  Notes  [facility]  586-249-4233   [place]  [phone]   [facility] [place]   Chinita Pester  (660)678-6058   Residential & Outpatient Substance Abuse Program  (365)835-0330   Psychological Services Organization         Address  Phone  Notes  [facility]  [place]  [phone]   [place] [address] [place] or [phone]    Mobile Crisis Teams Organization         Address  Phone  Notes  [facility],  Mobile Crisis Care Unit  (740) [phone]   [facility]  [address] [place], [place]   [place] [address], [place] [phone]    Self-Help/Support Groups Organization         Address  Phone             Notes  [place]. of  - variety of support groups  [place] Call for  more information  °Narcotics Anonymous (NA), [facility] [address], °[place]  2 meetings at this location  ° °Residential Treatment Programs °Organization         Address  Phone  Notes  °[facility] [address],    °[place]  1-866-801-8205   °[facility] ° [address], [place], [place] [phone]   °[facility] [address], [place] [phone] Admissions: 8am-3pm M-F  °[facility] [address]    °[place], [place] [phone]   °[facility] [address], [place], [place] [phone]   °[facility] [address]  °[place], [place] 336-285-9073   °[facility] - Intensive Outpatient [address], [place], [place] [phone]   °[facility] [address]  °[place], [place] 1-877-615-2722 or 336-784-9470   °[facility] ([facility]) [address] [place], [place] 336-227-7417 Accepts Medicaid  °[facility] [address]  ° [place] 1-800-659-3381 Substance Abuse/Addiction Treatment  ° °[place] Behavioral Health Resources °Organization         Address  Phone  Notes  °[facility]  (888) 581-9988   °Julie Brannon, PhD [address] [place], [place]   (336) 349-5553 or (336) 951-0000   °[facility]   [address] °[place], [place] (336) 349-4454   °[facility] [address], [place], [place] (336) [phone] Insurance/Medicaid/sponsorship through [facility]  °[facility] [address]                                    [place], [place] (336) [phone] Therapy/tele-psych/case  °[facility] [address]  ° [place], [place] (336) [phone]    °Dr. Arfeen  (336) [phone]   °[facility] [place]  [employer] [place] [facility] 1) [address], [place] °2) [address], [place] °3)  [address], [place] (336) [phone] °(336) 342-7768 ° °(336) 342-8140   °[place] Child Abuse Hotline (336) 342-1394 or (336) 342-3537 (After Hours)    ° ° ° °

## 2014-03-31 NOTE — ED Provider Notes (Signed)
CSN: [id number]     Arrival date & time 03/31/14  2108 History   First MD Initiated Contact with Patient 03/31/14 2115     Chief Complaint  Patient presents with  . Dental Pain     (Consider location/radiation/quality/duration/timing/severity/associated sxs/prior Treatment) The history is provided by the patient and medical records.    This is a 24 year old male with no significant past medical history presenting to the ED for right lower dental pain for the past 5 days. Patient states 2 days ago he began having associated swelling. No fever or chills. No difficulty swallowing or shortness of breath. Patient has been taking multiple over-the-counter pain relievers without noted improvement. Patient is not currently established with a dentist.  History reviewed. No pertinent past medical history. History reviewed. No pertinent past surgical history. No family history on file. History  Substance Use Topics  . Smoking status: Current Every Day Smoker  . Smokeless tobacco: Not on file  . Alcohol Use: Yes    Review of Systems  HENT: Positive for dental problem and facial swelling.   All other systems reviewed and are negative.     Allergies  Review of patient's allergies indicates no known allergies.  Home Medications   Prior to Admission medications   Medication Sig Start Date End Date Taking? Authorizing Provider  amoxicillin (AMOXIL) 500 MG capsule Take 1 capsule (500 mg total) by mouth 3 (three) times daily. 07/08/13   Nicole Pisciotta, PA-C  HYDROcodone-acetaminophen (NORCO/VICODIN) 5-325 MG per tablet Take 1-2 tablets by mouth every 6 hours as needed for pain. 07/08/13   Nicole Pisciotta, PA-C  HYDROcodone-acetaminophen (NORCO/VICODIN) 5-325 MG per tablet Take 1 tablet by mouth every 6 (six) hours as needed. 10/24/13   Jimmye Normanavid John Smith, NP  penicillin v potassium (VEETID) 500 MG tablet Take 1 tablet (500 mg total) by mouth 3 (three) times daily. 10/24/13   Jimmye Normanavid John Smith, NP    BP 138/80 mmHg  Pulse 77  Temp(Src) 98.8 F (37.1 C) (Oral)  Resp 14  Ht 6\' 2"  (1.88 m)  Wt 210 lb (95.255 kg)  BMI 26.95 kg/m2  SpO2 100%   Physical Exam  Constitutional: He is oriented to person, place, and time. He appears well-developed and well-nourished.  HENT:  Head: Normocephalic and atraumatic.  Mouth/Throat: Uvula is midline, oropharynx is clear and moist and mucous membranes are normal. Abnormal dentition. Dental abscesses and dental caries present. No uvula swelling. No oropharyngeal exudate, posterior oropharyngeal edema, posterior oropharyngeal erythema or tonsillar abscesses.  Teeth largely in poor dentition, right lower molars with obvious cavities, surrounding gingiva with obvious dental abscess but no appreciable fluctuance or drainable fluid collection, mild swelling of right cheek without extension into neck; handling secretions appropriately, no trismus  Eyes: Conjunctivae and EOM are normal. Pupils are equal, round, and reactive to light.  Neck: Normal range of motion.  Cardiovascular: Normal rate, regular rhythm and normal heart sounds.   Pulmonary/Chest: Effort normal and breath sounds normal. No respiratory distress. He has no wheezes.  Musculoskeletal: Normal range of motion.  Neurological: He is alert and oriented to person, place, and time.  Skin: Skin is warm and dry.  Psychiatric: He has a normal mood and affect.  Nursing note and vitals reviewed.   ED Course  Procedures (including critical care time) Labs Review Labs Reviewed - No data to display  Imaging Review No results found.   EKG Interpretation None      MDM   Final diagnoses:  Dental abscess  Pain, dental   24 year old male with dental pain secondary to right lower dental abscess. Patient afebrile and nontoxic in appearance. He does have mild swelling of his right cheek without extension into neck, he is handling secretions well. Patient be started on antibiotics and pain  medication. He was instructed to follow with dentist, referrals and resources guide provided.  Discussed plan with patient, he/she acknowledged understanding and agreed with plan of care.  Return precautions given for new or worsening symptoms.  [place] Hatchet, PA-C 03/31/14 9604  Toy Cookey, MD 03/31/14 [phone]

## 2014-06-15 ENCOUNTER — Emergency Department (HOSPITAL_COMMUNITY): Payer: Self-pay

## 2014-06-15 ENCOUNTER — Encounter (HOSPITAL_COMMUNITY): Payer: Self-pay | Admitting: Physical Medicine and Rehabilitation

## 2014-06-15 ENCOUNTER — Emergency Department (HOSPITAL_COMMUNITY)
Admission: EM | Admit: 2014-06-15 | Discharge: 2014-06-15 | Disposition: A | Discharge status: Home / Self Care | Payer: Self-pay | Attending: Emergency Medicine | Admitting: Emergency Medicine

## 2014-06-15 DIAGNOSIS — M79671 Pain in right foot: Secondary | ICD-10-CM | POA: Insufficient documentation

## 2014-06-15 DIAGNOSIS — M79604 Pain in right leg: Secondary | ICD-10-CM | POA: Insufficient documentation

## 2014-06-15 DIAGNOSIS — Z792 Long term (current) use of antibiotics: Secondary | ICD-10-CM | POA: Insufficient documentation

## 2014-06-15 DIAGNOSIS — Z720 Tobacco use: Secondary | ICD-10-CM | POA: Insufficient documentation

## 2014-06-15 DIAGNOSIS — Z79899 Other long term (current) drug therapy: Secondary | ICD-10-CM | POA: Insufficient documentation
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
## 2014-06-15 NOTE — Discharge Instructions (Signed)
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
## 2016-06-18 ENCOUNTER — Encounter (HOSPITAL_COMMUNITY): Payer: Self-pay | Admitting: *Deleted

## 2016-06-18 ENCOUNTER — Emergency Department (HOSPITAL_COMMUNITY)
Admission: EM | Admit: 2016-06-18 | Discharge: 2016-06-18 | Disposition: A | Discharge status: Home / Self Care | Payer: Self-pay | Attending: Emergency Medicine | Admitting: Emergency Medicine

## 2016-06-18 DIAGNOSIS — K0889 Other specified disorders of teeth and supporting structures: Secondary | ICD-10-CM | POA: Insufficient documentation

## 2016-06-18 DIAGNOSIS — F17210 Nicotine dependence, cigarettes, uncomplicated: Secondary | ICD-10-CM | POA: Insufficient documentation

## 2016-06-18 MED ORDER — IBUPROFEN 600 MG PO TABS: 600.0000 mg | ORAL_TABLET | Freq: Four times a day (QID) | ORAL | 0 refills | Status: DC | PRN

## 2016-06-18 MED ORDER — PENICILLIN V POTASSIUM 500 MG PO TABS: 500.0000 mg | ORAL_TABLET | Freq: Three times a day (TID) | ORAL | 0 refills | Status: DC

## 2016-06-18 NOTE — ED Notes (Signed)
Pt stable, understands discharge instructions, and reasons for return.   

## 2016-06-18 NOTE — Discharge Instructions (Signed)
Please call and follow up closely with dentist for further management of your recurrent dental pain.

## 2016-06-18 NOTE — ED Provider Notes (Signed)
[facility]-EMERGENCY DEPT Provider Note   CSN: [id number] Arrival date & time: 06/18/16  1402   By signing my name below, I, Bobbie Stack, attest that this documentation has been prepared under the direction and in the presence of Fayrene Helper, PA-C. Electronically Signed: Bobbie Stack, Scribe. 06/18/16. 3:12 PM. History   Chief Complaint Chief Complaint  Patient presents with  . Dental Pain     The history is provided by the patient. No language interpreter was used.  HPI Comments: William Porter is a 26 y.o. male who presents to the Emergency Department complaining of worsening right-sided lower dental pain since yesterday. Patient states that he has been having intermittent right sided dental pain for quite some time. He states that he woke up yesterday morning and went to work when the most recent episode of pain began. Patient states that the pain radiates to his ear and neck. He describes the pain as "throbbing". Pain worsens with chewing and cold temperatures. He states that he does not have a dentist. He denies difficulty swallowing or fever.  History reviewed. No pertinent past medical history.  There are no active problems to display for this patient.   History reviewed. No pertinent surgical history.     Home Medications    Prior to Admission medications   Medication Sig Start Date End Date Taking? Authorizing Provider  amoxicillin (AMOXIL) 500 MG capsule Take 1 capsule (500 mg total) by mouth 3 (three) times daily. 07/08/13   Nicole Pisciotta, PA-C  HYDROcodone-acetaminophen (NORCO/VICODIN) 5-325 MG per tablet Take 1 tablet by mouth every 4 (four) hours as needed. 03/31/14   Garlon Hatchet, PA-C  penicillin v potassium (VEETID) 500 MG tablet Take 1 tablet (500 mg total) by mouth 4 (four) times daily. 03/31/14   Garlon Hatchet, PA-C    Family History No family history on file.  Social History Social History  Substance Use Topics  . Smoking status: Current  Every Day Smoker    Types: Cigarettes  . Smokeless tobacco: Never Used  . Alcohol use Yes     Allergies   Patient has no known allergies.   Review of Systems Review of Systems  Constitutional: Negative for fever.  HENT: Positive for dental problem and ear pain. Negative for trouble swallowing.   Neurological: Negative for headaches.     Physical Exam Updated Vital Signs BP 114/73 (BP Location: Right Arm)   Pulse (!) 54   Temp 98.5 F (36.9 C) (Oral)   Resp 18   Ht  (1.88 m)   Wt 215 lb (97.5 kg)   SpO2 98%   BMI 27.60 kg/m   Physical Exam  Constitutional: He is oriented to person, place, and time. He appears well-developed and well-nourished.  HENT:  Head: Normocephalic.  Multiple dental decay throughout mouth noted. Tenderness to tooth 28 with moderate amount of dental decay. No obvious abscess noted. No trismus.  Eyes: EOM are normal.  Neck: Normal range of motion.  Pulmonary/Chest: Effort normal.  Abdominal: He exhibits no distension.  Musculoskeletal: Normal range of motion.  Neurological: He is alert and oriented to person, place, and time.  Psychiatric: He has a normal mood and affect.  Nursing note and vitals reviewed.  ED Treatments / Results  DIAGNOSTIC STUDIES: Oxygen Saturation is 98% on RA, normal by my interpretation.    COORDINATION OF CARE: 3:06 PM Discussed treatment plan with pt at bedside and pt agreed to plan. I will start the patient on some antibiotics.  I also advised the patient that he should see a dentist.  Labs (all labs ordered are listed, but only abnormal results are displayed) Labs Reviewed - No data to display  EKG  EKG Interpretation None       Radiology No results found.  Procedures Procedures (including critical care time)  Medications Ordered in ED Medications - No data to display   Initial Impression / Assessment and Plan / ED Course  I have reviewed the triage vital signs and the nursing  notes.  Pertinent labs & imaging results that were available during my care of the patient were reviewed by me and considered in my medical decision making (see chart for details).     BP 114/73 (BP Location: Right Arm)   Pulse (!) 54   Temp 98.5 F (36.9 C) (Oral)   Resp 18   Ht  (1.88 m)   Wt 97.5 kg   SpO2 98%   BMI 27.60 kg/m    Final Clinical Impressions(s) / ED Diagnoses   Final diagnoses:  Pain, dental    New Prescriptions New Prescriptions   IBUPROFEN (ADVIL,MOTRIN) 600 MG TABLET    Take 1 tablet (600 mg total) by mouth every 6 (six) hours as needed.   I personally performed the services described in this documentation, which was scribed in my presence. The recorded information has been reviewed and is accurate.   Patient with dentalgia.  No abscess requiring immediate incision and drainage.  Exam not concerning for Ludwig's angina or pharyngeal abscess.  Will treat with NSAIDs and PCN. Pt instructed to follow-up with dentist.  Discussed return precautions. Pt safe for discharge.    Fayrene Helper, PA-C 06/18/16 1517    Charlynne Pander, MD 06/18/16 [phone]

## 2016-06-18 NOTE — ED Triage Notes (Signed)
Pt states he has right lower broken tooth pain and states radiates to ear and neck.

## 2016-08-06 ENCOUNTER — Encounter (HOSPITAL_COMMUNITY): Payer: Self-pay | Admitting: Emergency Medicine

## 2016-08-06 ENCOUNTER — Emergency Department (HOSPITAL_COMMUNITY)
Admission: EM | Admit: 2016-08-06 | Discharge: 2016-08-06 | Disposition: A | Discharge status: Home / Self Care | Payer: Self-pay | Attending: Emergency Medicine | Admitting: Emergency Medicine

## 2016-08-06 ENCOUNTER — Encounter (HOSPITAL_COMMUNITY): Payer: Self-pay

## 2016-08-06 DIAGNOSIS — K0889 Other specified disorders of teeth and supporting structures: Secondary | ICD-10-CM

## 2016-08-06 DIAGNOSIS — F17210 Nicotine dependence, cigarettes, uncomplicated: Secondary | ICD-10-CM | POA: Insufficient documentation

## 2016-08-06 DIAGNOSIS — K029 Dental caries, unspecified: Secondary | ICD-10-CM | POA: Insufficient documentation

## 2016-08-06 MED ORDER — OXYCODONE-ACETAMINOPHEN 5-325 MG PO TABS
ORAL_TABLET | ORAL | Status: AC
  Filled 2016-08-06: qty 1

## 2016-08-06 MED ORDER — BENZOCAINE 10 % MT GEL
Freq: Once | OROMUCOSAL | Status: AC
  Administered 2016-08-06: 1 via OROMUCOSAL
  Filled 2016-08-06: qty 9.4

## 2016-08-06 MED ORDER — TRAMADOL HCL 50 MG PO TABS: 50.0000 mg | ORAL_TABLET | Freq: Four times a day (QID) | ORAL | 0 refills | Status: DC | PRN

## 2016-08-06 MED ORDER — PENICILLIN V POTASSIUM 500 MG PO TABS: 500.0000 mg | ORAL_TABLET | Freq: Three times a day (TID) | ORAL | 0 refills | Status: DC

## 2016-08-06 MED ORDER — NAPROXEN 500 MG PO TABS
500.0000 mg | ORAL_TABLET | Freq: Once | ORAL | Status: AC
  Administered 2016-08-06: 500 mg via ORAL
  Filled 2016-08-06: qty 1

## 2016-08-06 NOTE — ED Provider Notes (Signed)
[facility] Provider Note   CSN: [id number] Arrival date & time: 08/06/16  1858   By signing my name below, I, Teofilo Pod, attest that this documentation has been prepared under the direction and in the presence of Kendra Woolford, PA-C. Electronically Signed: Teofilo Pod, ED Scribe. 08/06/2016. 9:59 PM.   History   Chief Complaint Chief Complaint  Patient presents with  . Dental Pain    The history is provided by the patient. No language interpreter was used.   HPI Comments:  William Porter is a 26 y.o. male who presents to the Emergency Department complaining of ongoing right sided dental pain x 2 days. Pt was seen here this AM and was given antibiotics that have provided no relief. He reports that he has taken the medication as prescribed. Pt reports increased pain with chewing and swallowing. No drooling. Pt does not have a dentist. No alleviating factors noted. Pt denies other associated symptoms.   History reviewed. No pertinent past medical history.  There are no active problems to display for this patient.   History reviewed. No pertinent surgical history.   Home Medications    Prior to Admission medications   Medication Sig Start Date End Date Taking? Authorizing Provider  amoxicillin (AMOXIL) 500 MG capsule Take 1 capsule (500 mg total) by mouth 3 (three) times daily. 07/08/13   Pisciotta, Joni Reining, PA-C  HYDROcodone-acetaminophen (NORCO/VICODIN) 5-325 MG per tablet Take 1 tablet by mouth every 4 (four) hours as needed. 03/31/14   Garlon Hatchet, PA-C  ibuprofen (ADVIL,MOTRIN) 600 MG tablet Take 1 tablet (600 mg total) by mouth every 6 (six) hours as needed. 06/18/16   Fayrene Helper, PA-C  penicillin v potassium (VEETID) 500 MG tablet Take 1 tablet (500 mg total) by mouth 3 (three) times daily. 08/06/16   Antony Madura, PA-C  traMADol (ULTRAM) 50 MG tablet Take 1 tablet (50 mg total) by mouth every 6 (six) hours as needed for severe pain. 08/06/16    Antony Madura, PA-C    Family History No family history on file.  Social History Social History  Substance Use Topics  . Smoking status: Current Every Day Smoker    Types: Cigarettes  . Smokeless tobacco: Never Used  . Alcohol use Yes     Allergies   Patient has no known allergies.   Review of Systems Review of Systems  Constitutional: Negative for activity change.  HENT: Positive for dental problem and trouble swallowing (painful). Negative for drooling and facial swelling.   Respiratory: Negative for shortness of breath.   Cardiovascular: Negative for chest pain.  Gastrointestinal: Negative for abdominal pain.  Musculoskeletal: Negative for back pain.  Skin: Negative for rash.   Physical Exam Updated Vital Signs BP (!) 146/102 (BP Location: Right Arm)   Pulse (!) 57   Temp 98.5 F (36.9 C) (Oral)   Resp 16   SpO2 99%   Physical Exam  Constitutional: He appears well-developed.  HENT:  Head: Normocephalic.  Mouth/Throat: Oropharynx is clear and moist. No oropharyngeal exudate.  Dental cary and tooth 31 broken. No obvious abscesses. Uvula midline. No trismus. Tolerating secretions.  Eyes: Conjunctivae are normal.  Neck: Neck supple.  No nuchal rigidity or meningismus   Cardiovascular: Normal rate and regular rhythm.   No murmur heard. Pulmonary/Chest: Effort normal and breath sounds normal. No respiratory distress. He has no wheezes. He has no rales. He exhibits no tenderness.  Abdominal: Soft. He exhibits no distension.  Neurological: He is alert.  Skin: Skin is warm and dry.  Psychiatric: His behavior is normal.  Nursing note and vitals reviewed.  ED Treatments / Results  DIAGNOSTIC STUDIES:  Oxygen Saturation is 99% on RA, normal by my interpretation.    COORDINATION OF CARE:  9:56 PM Discussed treatment plan with pt at bedside and pt agreed to plan.   Labs (all labs ordered are listed, but only abnormal results are displayed) Labs Reviewed - No  data to display  EKG  EKG Interpretation None       Radiology No results found.  Procedures Procedures (including critical care time)  Medications Ordered in ED Medications - No data to display   Initial Impression / Assessment and Plan / ED Course  I have reviewed the triage vital signs and the nursing notes.  Pertinent labs & imaging results that were available during my care of the patient were reviewed by me and considered in my medical decision making (see chart for details).     Patient with toothache, same from earlier today and 06/18/16.  No gross abscess.  Exam unconcerning for Ludwig's angina or spread of infection. Earlier today he treated with PCN and discharged home with Tramadol.  Dental block offered, but patient declines at this time because he states that he doesn't like needles. He returns because the pain continues. Urged patient to follow-up with dentist for definitive treatment. NAD. VSS. Thet patient is encouraged to follow the discharge plan from earlier today. The patient is stable for discharge.     Final Clinical Impressions(s) / ED Diagnoses   Final diagnoses:  Pain, dental  Dental caries    New Prescriptions Discharge Medication List as of 08/06/2016 10:07 PM    I personally performed the services described in this documentation, which was scribed in my presence. The recorded information has been reviewed and is accurate.     Frederik PearMcDonald, Savilla Turbyfill A, PA-C 08/11/16 [id number]    Raeford RazorKohut, Stephen, MD 08/17/16 [phone]

## 2016-08-06 NOTE — ED Notes (Signed)
meds requested from pharmacy.

## 2016-08-06 NOTE — Discharge Instructions (Signed)
You should have already completed your course of antibiotics. We recommend that you continue with antibiotics until you're able to see a dentist. Continue 600 mg ibuprofen every 6 hours. You may take tramadol as needed for severe pain. Follow-up with a dentist.

## 2016-08-06 NOTE — Discharge Instructions (Signed)
Please call Dr. Michiel SitesKoelling office to schedule an appointment for your dental pain. You can take ibuprofen or Tylenol at home for pain control. You can also apply an ice pack to the side of your face to help with pain. If you develop severe drooling, muffled voice, or difficulty breathing, please return to the emergency department for evaluation.

## 2016-08-06 NOTE — ED Triage Notes (Signed)
Pt states that he is having pain on his R side wisdom tooth, was seen her this AM for the same, states that tramadol is not working .

## 2016-08-06 NOTE — ED Notes (Signed)
Pt declined discharge vital signs or signature d/t dissatisfaction with care.  Verbalized understanding of discharge instructions

## 2016-08-06 NOTE — ED Provider Notes (Signed)
[facility] Provider Note   CSN: [id number] Arrival date & time: 08/06/16  0209    History   Chief Complaint Chief Complaint  Patient presents with  . Dental Pain    HPI William Porter is a 26 y.o. male.  The history is provided by the patient. No language interpreter was used.  Dental Pain   This is a new problem. Episode onset: 1.5 months ago. The problem occurs every several days. Progression since onset: waxing and waning, persistent. The pain is mild. Treatments tried: ibuprofen. The treatment provided no relief.    History reviewed. No pertinent past medical history.  There are no active problems to display for this patient.   History reviewed. No pertinent surgical history.    Home Medications    Prior to Admission medications   Medication Sig Start Date End Date Taking? Authorizing Provider  amoxicillin (AMOXIL) 500 MG capsule Take 1 capsule (500 mg total) by mouth 3 (three) times daily. 07/08/13   Pisciotta, Joni Reining, PA-C  HYDROcodone-acetaminophen (NORCO/VICODIN) 5-325 MG per tablet Take 1 tablet by mouth every 4 (four) hours as needed. 03/31/14   Garlon Hatchet, PA-C  ibuprofen (ADVIL,MOTRIN) 600 MG tablet Take 1 tablet (600 mg total) by mouth every 6 (six) hours as needed. 06/18/16   Fayrene Helper, PA-C  penicillin v potassium (VEETID) 500 MG tablet Take 1 tablet (500 mg total) by mouth 3 (three) times daily. 08/06/16   Antony Madura, PA-C  traMADol (ULTRAM) 50 MG tablet Take 1 tablet (50 mg total) by mouth every 6 (six) hours as needed for severe pain. 08/06/16   Antony Madura, PA-C    Family History No family history on file.  Social History Social History  Substance Use Topics  . Smoking status: Current Every Day Smoker    Types: Cigarettes  . Smokeless tobacco: Never Used  . Alcohol use Yes     Allergies   Patient has no known allergies.   Review of Systems Review of Systems Ten systems reviewed and are negative for acute change, except  as noted in the HPI.    Physical Exam Updated Vital Signs BP (!) 147/108 (BP Location: Left Arm)   Pulse (!) 110   Temp 98.3 F (36.8 C) (Oral)   Resp 17   SpO2 99%   Physical Exam  Constitutional: He is oriented to person, place, and time. He appears well-developed and well-nourished. No distress.  Nontoxic appearing in no acute distress  HENT:  Head: Normocephalic and atraumatic.  Various areas of dental fracture and decay. Tenderness centered around right lower first and third molars. No gingival swelling or fluctuance. No injury on drainage. Uvula midline. Patient tolerating secretions without difficulty. Soft oral floor.  Eyes: Conjunctivae and EOM are normal. No scleral icterus.  Neck: Normal range of motion.  No nuchal rigidity or meningismus  Pulmonary/Chest: Effort normal. No respiratory distress.  Respirations even and unlabored  Musculoskeletal: Normal range of motion.  Neurological: He is alert and oriented to person, place, and time. He exhibits normal muscle tone. Coordination normal.  Skin: Skin is warm and dry. No rash noted. He is not diaphoretic. No erythema. No pallor.  Psychiatric: He has a normal mood and affect. His behavior is normal.  Nursing note and vitals reviewed.    ED Treatments / Results  Labs (all labs ordered are listed, but only abnormal results are displayed) Labs Reviewed - No data to display  EKG  EKG Interpretation None  Radiology No results found.  Procedures Procedures (including critical care time)  Medications Ordered in ED Medications  oxyCODONE-acetaminophen (PERCOCET/ROXICET) 5-325 MG per tablet (not administered)  naproxen (NAPROSYN) tablet 500 mg (not administered)  benzocaine (ORAJEL) 10 % mucosal gel (not administered)     Initial Impression / Assessment and Plan / ED Course  I have reviewed the triage vital signs and the nursing notes.  Pertinent labs & imaging results that were available during my care  of the patient were reviewed by me and considered in my medical decision making (see chart for details).     Patient with toothache. Seen for same on 06/18/16. Reports persistent and worsening pain. No gross abscess. Exam unconcerning for Ludwig's angina or spread of infection. Will continue with penicillin and NSAIDs. Urged patient to follow-up with dentist. Referral and resource guide provided. Patient discharged in stable condition with no unaddressed concerns.   Final Clinical Impressions(s) / ED Diagnoses   Final diagnoses:  Tooth pain    New Prescriptions New Prescriptions   TRAMADOL (ULTRAM) 50 MG TABLET    Take 1 tablet (50 mg total) by mouth every 6 (six) hours as needed for severe pain.     Antony MaduraHumes, Cecely Rengel, PA-C 08/06/16 [id number]    Devoria AlbeKnapp, Iva, MD 08/06/16 0900

## 2016-08-06 NOTE — ED Notes (Signed)
Pt alert, interactive. C/o rt, lower dental pain. 8/10.

## 2016-08-06 NOTE — ED Notes (Signed)
Pt states he was here this morning around 2am for same.  Pt states he has taken his antibiotics and pain medications with no relief.

## 2018-05-04 ENCOUNTER — Encounter (HOSPITAL_COMMUNITY): Payer: Self-pay

## 2018-05-04 ENCOUNTER — Emergency Department (HOSPITAL_COMMUNITY)
Admission: EM | Admit: 2018-05-04 | Discharge: 2018-05-04 | Disposition: A | Discharge status: Home / Self Care | Payer: Self-pay | Attending: Emergency Medicine | Admitting: Emergency Medicine

## 2018-05-04 ENCOUNTER — Other Ambulatory Visit: Payer: Self-pay

## 2018-05-04 DIAGNOSIS — K047 Periapical abscess without sinus: Secondary | ICD-10-CM | POA: Insufficient documentation

## 2018-05-04 DIAGNOSIS — K0889 Other specified disorders of teeth and supporting structures: Secondary | ICD-10-CM

## 2018-05-04 DIAGNOSIS — K029 Dental caries, unspecified: Secondary | ICD-10-CM | POA: Insufficient documentation

## 2018-05-04 DIAGNOSIS — F17210 Nicotine dependence, cigarettes, uncomplicated: Secondary | ICD-10-CM | POA: Insufficient documentation

## 2018-05-04 MED ORDER — IBUPROFEN 600 MG PO TABS: 600.0000 mg | ORAL_TABLET | Freq: Three times a day (TID) | ORAL | 0 refills | Status: DC | PRN

## 2018-05-04 MED ORDER — IBUPROFEN 200 MG PO TABS
600.0000 mg | ORAL_TABLET | Freq: Once | ORAL | Status: AC
  Administered 2018-05-04: 600 mg via ORAL
  Filled 2018-05-04: qty 3

## 2018-05-04 MED ORDER — BUPIVACAINE-EPINEPHRINE (PF) 0.5% -1:200000 IJ SOLN
1.8000 mL | Freq: Once | INTRAMUSCULAR | Status: AC
  Administered 2018-05-04: 1.8 mL
  Filled 2018-05-04: qty 1.8

## 2018-05-04 MED ORDER — PENICILLIN V POTASSIUM 500 MG PO TABS: 500.0000 mg | ORAL_TABLET | Freq: Four times a day (QID) | ORAL | 0 refills | Status: AC

## 2018-05-04 MED ORDER — AMOXICILLIN 500 MG PO CAPS
1000.0000 mg | ORAL_CAPSULE | Freq: Once | ORAL | Status: AC
  Administered 2018-05-04: 1000 mg via ORAL
  Filled 2018-05-04: qty 2

## 2018-05-04 NOTE — ED Triage Notes (Signed)
Pt reports left sided dental pain and swelling since yesterday. Pt reports hx of abscess.

## 2018-05-04 NOTE — ED Provider Notes (Signed)
[facility]-EMERGENCY DEPT Provider Note   CSN: [id number] Arrival date & time: 05/04/18  2228    History   Chief Complaint Chief Complaint  Patient presents with  . Dental Pain    HPI William Porter is a 28 y.o. male.     HPI   28 yo M here with left lower dental pain. Pt states he has a known fx of his left lower molar tooth. He has had progressively worsening aching, throbbing, pain with worsening w/ eating, palpation. No alleviating factors. Pain is worse with eating, palpation. No alleviating factors. No sublingual swelling. No difficulty swallowing or speaking. Hs tried OTC meds w/o relief. No alleviating factors.  History reviewed. No pertinent past medical history.  There are no active problems to display for this patient.   History reviewed. No pertinent surgical history.      Home Medications    Prior to Admission medications   Medication Sig Start Date End Date Taking? Authorizing Provider  amoxicillin (AMOXIL) 500 MG capsule Take 1 capsule (500 mg total) by mouth 3 (three) times daily. 07/08/13   Pisciotta, Joni Reining, PA-C  HYDROcodone-acetaminophen (NORCO/VICODIN) 5-325 MG per tablet Take 1 tablet by mouth every 4 (four) hours as needed. 03/31/14   Garlon Hatchet, PA-C  ibuprofen (ADVIL,MOTRIN) 600 MG tablet Take 1 tablet (600 mg total) by mouth every 8 (eight) hours as needed for moderate pain. 05/04/18   Shaune Pollack, MD  penicillin v potassium (VEETID) 500 MG tablet Take 1 tablet (500 mg total) by mouth 4 (four) times daily for 7 days. 05/04/18 05/11/18  Shaune Pollack, MD  traMADol (ULTRAM) 50 MG tablet Take 1 tablet (50 mg total) by mouth every 6 (six) hours as needed for severe pain. 08/06/16   Antony Madura, PA-C    Family History History reviewed. No pertinent family history.  Social History Social History   Tobacco Use  . Smoking status: Current Every Day Smoker    Types: Cigarettes  . Smokeless tobacco: Never Used    Substance Use Topics  . Alcohol use: Yes  . Drug use: Yes    Types: Marijuana     Allergies   Patient has no known allergies.   Review of Systems Review of Systems  Constitutional: Negative for chills and fever.  HENT: Positive for dental problem and facial swelling.   Respiratory: Negative for shortness of breath.   Cardiovascular: Negative for chest pain.  Musculoskeletal: Negative for neck pain.  Skin: Negative for rash and wound.  Allergic/Immunologic: Negative for immunocompromised state.  Neurological: Negative for weakness and numbness.  Hematological: Does not bruise/bleed easily.  All other systems reviewed and are negative.    Physical Exam Updated Vital Signs BP (!) 168/106   Pulse 62   Temp 98.9 F (37.2 C) (Oral)   Resp 16   Ht 6\' 3"  (1.905 m)   Wt 99.8 kg   SpO2 96%   BMI 27.50 kg/m   Physical Exam Vitals signs and nursing note reviewed.  Constitutional:      General: He is not in acute distress.    Appearance: He is well-developed.  HENT:     Head: Normocephalic and atraumatic.     Comments: Poor dentition diffusely. There is moderate gingival edema and small gingival abscess abutting lateral/buccal aspect of left first premolar, which appears fractured. No sublingual edema, induration, or swelling. OP otherwise clear. Posterior OP patent and uvula midline Eyes:     Conjunctiva/sclera: Conjunctivae normal.  Neck:  Musculoskeletal: Neck supple.  Cardiovascular:     Rate and Rhythm: Normal rate and regular rhythm.     Heart sounds: Normal heart sounds.  Pulmonary:     Effort: Pulmonary effort is normal. No respiratory distress.     Breath sounds: No wheezing.  Abdominal:     General: There is no distension.  Skin:    General: Skin is warm.     Capillary Refill: Capillary refill takes less than 2 seconds.     Findings: No rash.  Neurological:     Mental Status: He is alert and oriented to person, place, and time.     Motor: No abnormal  muscle tone.      ED Treatments / Results  Labs (all labs ordered are listed, but only abnormal results are displayed) Labs Reviewed - No data to display  EKG None  Radiology No results found.  Procedures .Marland KitchenIncision and Drainage Date/Time: 05/05/2018 2:29 AM Performed by: Shaune Pollack, MD Authorized by: Shaune Pollack, MD   Consent:    Consent obtained:  Verbal   Consent given by:  Patient   Risks discussed:  Bleeding, damage to other organs, incomplete drainage, infection and pain   Alternatives discussed:  Alternative treatment and delayed treatment Location:    Type:  Abscess   Size:  1   Location:  Mouth   Mouth location: Gingival abutting buccal surface of premolar on left mandible. Pre-procedure details:    Skin preparation:  Betadine Anesthesia (see MAR for exact dosages):    Anesthesia method:  Nerve block   Block location:  Periapical/alveolar   Block needle gauge:  27 G   Block anesthetic:  Bupivacaine 0.5% WITH epi   Block injection procedure:  Anatomic landmarks identified, anatomic landmarks palpated, negative aspiration for blood, introduced needle and incremental injection   Block outcome:  Anesthesia achieved Procedure type:    Complexity:  Simple Procedure details:    Incision types:  Stab incision   Incision depth:  Dermal   Scalpel blade:  11   Wound management:  Irrigated with saline   Drainage:  Purulent   Drainage amount:  Scant   Wound treatment:  Wound left open Post-procedure details:    Patient tolerance of procedure:  Tolerated well, no immediate complications   (including critical care time)  Medications Ordered in ED Medications  bupivacaine-epinephrine (MARCAINE W/ EPI) 0.5% -1:200000 injection 1.8 mL (1.8 mLs Infiltration Given 05/04/18 2300)  amoxicillin (AMOXIL) capsule 1,000 mg (1,000 mg Oral Given 05/04/18 2339)  ibuprofen (ADVIL,MOTRIN) tablet 600 mg (600 mg Oral Given 05/04/18 2338)     Initial Impression /  Assessment and Plan / ED Course  I have reviewed the triage vital signs and the nursing notes.  Pertinent labs & imaging results that were available during my care of the patient were reviewed by me and considered in my medical decision making (see chart for details).        28 yo M here with likely active caries, reversible pulpitis and small gingival abscess of left molar/premolar. No signs of Ludwig's or deep neck infection. No sublingual edema, or TTP. Pt anesthetized, I&D performed which pt tolerated well. No signs of sepsis or extension to face or jaw. Will start on ABX, analgesics, refer for dentistry f/u.  Final Clinical Impressions(s) / ED Diagnoses   Final diagnoses:  Pain, dental  Dental caries    ED Discharge Orders         Ordered    ibuprofen (ADVIL,MOTRIN) 600  MG tablet  Every 8 hours PRN     05/04/18 2325    penicillin v potassium (VEETID) 500 MG tablet  4 times daily     05/04/18 2325           Shaune Pollack, MD 05/05/18 [id number]

## 2018-05-04 NOTE — ED Notes (Signed)
Bed: WTR7 Expected date:  Expected time:  Means of arrival:  Comments: 

## 2019-02-28 ENCOUNTER — Ambulatory Visit: Payer: HRSA Program | Attending: Internal Medicine

## 2019-02-28 DIAGNOSIS — Z20828 Contact with and (suspected) exposure to other viral communicable diseases: Secondary | ICD-10-CM | POA: Insufficient documentation

## 2019-02-28 DIAGNOSIS — Z20822 Contact with and (suspected) exposure to covid-19: Secondary | ICD-10-CM

## 2019-03-01 LAB — NOVEL CORONAVIRUS, NAA: SARS-CoV-2, NAA: NOT DETECTED

## 2019-04-14 ENCOUNTER — Encounter (HOSPITAL_COMMUNITY): Payer: Self-pay | Admitting: Emergency Medicine

## 2019-04-14 ENCOUNTER — Other Ambulatory Visit: Payer: Self-pay

## 2019-04-14 ENCOUNTER — Ambulatory Visit (HOSPITAL_COMMUNITY)
Admission: EM | Admit: 2019-04-14 | Discharge: 2019-04-14 | Disposition: A | Discharge status: Home / Self Care | Payer: Self-pay | Attending: Family Medicine | Admitting: Family Medicine

## 2019-04-14 DIAGNOSIS — M79641 Pain in right hand: Secondary | ICD-10-CM

## 2019-04-14 MED ORDER — NAPROXEN 500 MG PO TABS: 500.0000 mg | ORAL_TABLET | Freq: Two times a day (BID) | ORAL | 0 refills | Status: DC

## 2019-04-14 NOTE — ED Triage Notes (Addendum)
Pt states on Monday his R hand "kinda went numb", then on Tuesday when he woke up it was throbbing. 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 States two days ago he had swelling in his R hand. Swelling has since gone down. States he looked up "carpal tunnel" and he thinks that's whats going on. Pt states "I wasn't even going to come in but since I left work my job needs me to get a note".

## 2019-04-14 NOTE — Discharge Instructions (Signed)
Please use Naprosyn twice daily as needed for pain May try wrist brace to help limit motions that trigger pain and allow your hand/wrist to rest  If tingling/numbness returning not improving with Naprosyn please to follow-up

## 2019-04-14 NOTE — ED Provider Notes (Signed)
[place]    CSN: [id number] Arrival date & time: 04/14/19  1708      History   Chief Complaint Chief Complaint  Patient presents with  . Hand Pain    HPI William Porter is a 29 y.o. male no significant past medical history presenting today for evaluation of right hand pain.  Patient states that earlier this week he was having numbness and tingling sensation in his right hand.  Reports that the sensation was throughout all 5 fingers, denies being localized to specific fingers or area of hand.  The following day he reports that he noted some swelling within his hand along with throbbing pain.  He has stayed out of work and rest his hand and for the past 1 to 2 days his pain has improved, denies any current pain or any current numbness/tingling.  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  Denies history of similar.  Will have some similar but mild sensations in his left hand.  Patient is right-handed.  Denies any specific injury.  HPI  History reviewed. No pertinent past medical history.  There are no problems to display for this patient.   History reviewed. No pertinent surgical history.     Home Medications    Prior to Admission medications   Medication Sig Start Date End Date Taking? Authorizing Provider  ibuprofen (ADVIL,MOTRIN) 600 MG tablet Take 1 tablet (600 mg total) by mouth every 8 (eight) hours as needed for moderate pain. 05/04/18   Duffy Bruce, MD  naproxen (NAPROSYN) 500 MG tablet Take 1 tablet (500 mg total) by mouth 2 (two) times daily. 04/14/19   Keilly Fatula, Elesa Hacker, PA-C    Family History Family History  Problem Relation Age of Onset  . Healthy Mother   . Healthy Father     Social History Social History   Tobacco Use  . Smoking status: Current Every Day Smoker    Types: Cigarettes  . Smokeless tobacco: Never Used  Substance Use Topics  . Alcohol use: Yes  . Drug use: Yes    Types: Marijuana     Allergies     Bee venom   Review of Systems Review of Systems  Constitutional: Negative for fever.  Musculoskeletal: Positive for arthralgias, joint swelling and myalgias.  Skin: Negative for color change, rash and wound.  Neurological: Positive for numbness. Negative for dizziness, tremors, weakness, light-headedness and headaches.     Physical Exam Triage Vital Signs ED Triage Vitals  Enc Vitals Group     BP 04/14/19 1733 (!) 149/108     Pulse Rate 04/14/19 1733 81     Resp 04/14/19 1733 18     Temp 04/14/19 1733 98.4 F (36.9 C)     Temp src --      SpO2 04/14/19 1733 100 %     Weight --      Height --      Head Circumference --      Peak Flow --      Pain Score 04/14/19 1735 0     Pain Loc --      Pain Edu? --      Excl. in [place]? --    No data found.  Updated Vital Signs BP (!) 149/108   Pulse 81   Temp 98.4 F (36.9 C)   Resp 18   SpO2 100%   Visual Acuity Right Eye Distance:   Left Eye Distance:   Bilateral Distance:  Right Eye Near:   Left Eye Near:    Bilateral Near:     Physical Exam Vitals and nursing note reviewed.  Constitutional:      Appearance: He is well-developed.     Comments: No acute distress  HENT:     Head: Normocephalic and atraumatic.     Nose: Nose normal.  Eyes:     Conjunctiva/sclera: Conjunctivae normal.  Cardiovascular:     Rate and Rhythm: Normal rate.  Pulmonary:     Effort: Pulmonary effort is normal. No respiratory distress.  Abdominal:     General: There is no distension.  Musculoskeletal:        General: Normal range of motion.     Cervical back: Neck supple.     Comments: Right hand: No obvious swelling deformity or discoloration, nontender to palpation throughout carpals, metacarpals and all fingers, full active range of motion of phalanges and at wrist, radial pulse 2+, sensation intact distally, negative Finkelstein's, weakly positive Tinel's Grip strength 5/5 and equal bilaterally, finger abbuction and abduction 5/5  and equal bilaterally  Skin:    General: Skin is warm and dry.  Neurological:     Mental Status: He is alert and oriented to person, place, and time.      UC Treatments / Results  Labs (all labs ordered are listed, but only abnormal results are displayed) Labs Reviewed - No data to display  EKG   Radiology No results found.  Procedures Procedures (including critical care time)  Medications Ordered in UC Medications - No data to display  Initial Impression / Assessment and Plan / UC Course  I have reviewed the triage vital signs and the nursing notes.  Pertinent labs & imaging results that were available during my care of the patient were reviewed by me and considered in my medical decision making (see chart for details).     Currently without pain, symptoms have resolved from earlier this week.  Possible carpal tunnel although symptoms throughout all 5 fingers. Do not suspect DM or B12 deficiency at this time.  Discussed possible overuse or repetitive motion trigger of symptoms.  Provided Naprosyn to use as needed for any returning symptoms.  Discussed using wrist brace in future for lifting when symptoms flare to avoid triggering motions.  Return if symptoms not improving with Naprosyn and use of respiration future.  Discussed strict return precautions. Patient verbalized understanding and is agreeable with plan.  Final Clinical Impressions(s) / UC Diagnoses   Final diagnoses:  Hand pain, right     Discharge Instructions     Please use Naprosyn twice daily as needed for pain May try wrist brace to help limit motions that trigger pain and allow your hand/wrist to rest  If tingling/numbness returning not improving with Naprosyn please to follow-up    ED Prescriptions    Medication Sig Dispense Auth. Provider   naproxen (NAPROSYN) 500 MG tablet Take 1 tablet (500 mg total) by mouth 2 (two) times daily. 30 tablet Emanie Behan, Nambe C, PA-C     PDMP not reviewed  this encounter.   Lew Dawes, [place] 04/14/19 2126

## 2020-01-17 ENCOUNTER — Ambulatory Visit (HOSPITAL_COMMUNITY)
Admission: EM | Admit: 2020-01-17 | Discharge: 2020-01-17 | Disposition: A | Discharge status: Home / Self Care | Payer: Self-pay | Attending: Family Medicine | Admitting: Family Medicine

## 2020-01-17 ENCOUNTER — Other Ambulatory Visit: Payer: Self-pay

## 2020-01-17 ENCOUNTER — Encounter (HOSPITAL_COMMUNITY): Payer: Self-pay

## 2020-01-17 DIAGNOSIS — Z202 Contact with and (suspected) exposure to infections with a predominantly sexual mode of transmission: Secondary | ICD-10-CM

## 2020-01-17 MED ORDER — METRONIDAZOLE 500 MG PO TABS
ORAL_TABLET | ORAL | Status: AC
  Filled 2020-01-17: qty 4

## 2020-01-17 MED ORDER — METRONIDAZOLE 500 MG PO TABS
2000.0000 mg | ORAL_TABLET | Freq: Once | ORAL | Status: AC
  Administered 2020-01-17: 2000 mg via ORAL

## 2020-01-17 NOTE — ED Triage Notes (Signed)
Pt present STD exposure to Trichomonas. Pt states his partner informed him that she tested positive to trichomonas.

## 2020-01-17 NOTE — Discharge Instructions (Addendum)
Meds ordered this encounter  °Medications  ° metroNIDAZOLE (FLAGYL) tablet 2,000 mg  ° ° °

## 2020-01-18 NOTE — ED Provider Notes (Signed)
  [facility]   [id number] 01/17/20 Arrival Time: 1855  ASSESSMENT & PLAN:  1. STD exposure     No testing desired. Will treat for trichomonas.   Discharge Instructions      Meds ordered this encounter  Medications  . metroNIDAZOLE (FLAGYL) tablet 2,000 mg       Reviewed expectations re: course of current medical issues. Questions answered. Outlined signs and symptoms indicating need for more acute intervention. Patient verbalized understanding. After Visit Summary given.   SUBJECTIVE:  William Porter is a 29 y.o. male who reports trichomonas exposure. Afebrile. No abdominal or pelvic pain. No penile d/c. No n/v. No rashes or lesions. Reports that he is sexually active with single male partner.  OBJECTIVE:  Vitals:   01/17/20 1957  BP: 123/68  Pulse: 70  Resp: 16  Temp: 98.6 F (37 C)  TempSrc: Oral  SpO2: 99%     General appearance: alert, cooperative, appears stated age and no distress Lungs: unlabored respirations; speaks full sentences without difficulty Back: no CVA tenderness; FROM at waist GU: deferred Skin: warm and dry Psychological: alert and cooperative; normal mood and affect.    Labs Reviewed - No data to display  Allergies  Allergen Reactions  . Bee Venom     History reviewed. No pertinent past medical history. Family History  Problem Relation Age of Onset  . Healthy Mother   . Healthy Father    Social History   Socioeconomic History  . Marital status: Single    Spouse name: Not on file  . Number of children: Not on file  . Years of education: Not on file  . Highest education level: Not on file  Occupational History  . Not on file  Tobacco Use  . Smoking status: Current Every Day Smoker    Types: Cigarettes  . Smokeless tobacco: Never Used  Substance and Sexual Activity  . Alcohol use: Yes  . Drug use: Yes    Types: Marijuana  . Sexual activity: Not on file  Other Topics Concern  . Not on file    Social History Narrative  . Not on file   Social Determinants of Health   Financial Resource Strain:   . Difficulty of Paying Living Expenses: Not on file  Food Insecurity:   . Worried About Programme researcher, broadcasting/film/video in the Last Year: Not on file  . Ran Out of Food in the Last Year: Not on file  Transportation Needs:   . Lack of Transportation (Medical): Not on file  . Lack of Transportation (Non-Medical): Not on file  Physical Activity:   . Days of Exercise per Week: Not on file  . Minutes of Exercise per Session: Not on file  Stress:   . Feeling of Stress : Not on file  Social Connections:   . Frequency of Communication with Friends and Family: Not on file  . Frequency of Social Gatherings with Friends and Family: Not on file  . Attends Religious Services: Not on file  . Active Member of Clubs or Organizations: Not on file  . Attends Banker Meetings: Not on file  . Marital Status: Not on file  Intimate Partner Violence:   . Fear of Current or Ex-Partner: Not on file  . Emotionally Abused: Not on file  . Physically Abused: Not on file  . Sexually Abused: Not on file          Mardella Layman, MD 01/18/20 [phone]

## 2020-03-07 ENCOUNTER — Other Ambulatory Visit: Payer: Self-pay

## 2020-03-07 ENCOUNTER — Ambulatory Visit
Admission: EM | Admit: 2020-03-07 | Discharge: 2020-03-07 | Disposition: A | Discharge status: Home / Self Care | Payer: Self-pay | Attending: Emergency Medicine | Admitting: Emergency Medicine

## 2020-03-07 ENCOUNTER — Encounter: Payer: Self-pay | Admitting: Emergency Medicine

## 2020-03-07 DIAGNOSIS — J111 Influenza due to unidentified influenza virus with other respiratory manifestations: Secondary | ICD-10-CM

## 2020-03-07 DIAGNOSIS — Z20822 Contact with and (suspected) exposure to covid-19: Secondary | ICD-10-CM

## 2020-03-07 MED ORDER — BENZONATATE 200 MG PO CAPS: 200.0000 mg | ORAL_CAPSULE | Freq: Three times a day (TID) | ORAL | 0 refills | Status: AC | PRN

## 2020-03-07 MED ORDER — IBUPROFEN 800 MG PO TABS: 800.0000 mg | ORAL_TABLET | Freq: Three times a day (TID) | ORAL | 0 refills | Status: DC

## 2020-03-07 MED ORDER — ONDANSETRON 4 MG PO TBDP: 4.0000 mg | ORAL_TABLET | Freq: Three times a day (TID) | ORAL | 0 refills | Status: DC | PRN

## 2020-03-07 NOTE — ED Triage Notes (Signed)
Pt here for cough, vomiting and not feeling well upon waking this am

## 2020-03-07 NOTE — Discharge Instructions (Signed)
Covid test pending, monitor my chart for results Ibuprofen and Tylenol for fevers body aches headaches Zofran as needed for any further nausea and vomiting Please focus on drinking plenty of fluids and staying hydrated Tessalon for cough May use other over-the-counter medicine for cough and congestion Rest and fluids Follow-up if not improving or worsening

## 2020-03-07 NOTE — ED Provider Notes (Signed)
[facility]    CSN: [id number] Arrival date & time: 03/07/20  1233      History   Chief Complaint Chief Complaint  Patient presents with   Cough        Vomiting    HPI Timothey A Higginbotham is a 29 y.o. male presenting today for evaluation of cough.  Reports symptoms began this morning upon waking.  Has had associated nausea and vomiting and generalized feeling unwell.  Reports some dizziness.  Reports possible exposure at work.    HPI  History reviewed. No pertinent past medical history.  There are no problems to display for this patient.   History reviewed. No pertinent surgical history.     Home Medications    Prior to Admission medications   Medication Sig Start Date End Date Taking? Authorizing Provider  benzonatate (TESSALON) 200 MG capsule Take 1 capsule (200 mg total) by mouth 3 (three) times daily as needed for up to 7 days for cough. 03/07/20 03/14/20 Yes Soila Printup C, PA-C  ibuprofen (ADVIL) 800 MG tablet Take 1 tablet (800 mg total) by mouth 3 (three) times daily. 03/07/20  Yes Junelle Hashemi C, PA-C  ondansetron (ZOFRAN ODT) 4 MG disintegrating tablet Take 1 tablet (4 mg total) by mouth every 8 (eight) hours as needed for nausea or vomiting. 03/07/20  Yes Mirah Nevins, Junius Creamer, PA-C    Family History Family History  Problem Relation Age of Onset   Healthy Mother    Healthy Father     Social History Social History   Tobacco Use   Smoking status: Current Every Day Smoker    Types: Cigarettes   Smokeless tobacco: Never Used  Substance Use Topics   Alcohol use: Yes   Drug use: Yes    Types: Marijuana     Allergies   Bee venom   Review of Systems Review of Systems  Constitutional: Negative for activity change, appetite change, chills, fatigue and fever.  HENT: Positive for congestion, rhinorrhea, sinus pressure and sore throat. Negative for ear pain and trouble swallowing.   Eyes: Negative for discharge and redness.   Respiratory: Positive for cough. Negative for chest tightness and shortness of breath.   Cardiovascular: Negative for chest pain.  Gastrointestinal: Negative for abdominal pain, diarrhea, nausea and vomiting.  Musculoskeletal: Negative for myalgias.  Skin: Negative for rash.  Neurological: Negative for dizziness, light-headedness and headaches.     Physical Exam Triage Vital Signs ED Triage Vitals  Enc Vitals Group     BP 03/07/20 1559 (!) 151/73     Pulse Rate 03/07/20 1559 87     Resp 03/07/20 1559 18     Temp 03/07/20 1559 99.8 F (37.7 C)     Temp Source 03/07/20 1559 Oral     SpO2 03/07/20 1559 97 %     Weight --      Height --      Head Circumference --      Peak Flow --      Pain Score 03/07/20 1600 5     Pain Loc --      Pain Edu? --      Excl. in [place]? --    No data found.  Updated Vital Signs BP (!) 151/73 (BP Location: Left Arm)    Pulse 87    Temp 99.8 F (37.7 C) (Oral)    Resp 18    SpO2 97%   Visual Acuity Right Eye Distance:   Left Eye Distance:  Bilateral Distance:    Right Eye Near:   Left Eye Near:    Bilateral Near:     Physical Exam Vitals and nursing note reviewed.  Constitutional:      Appearance: He is well-developed and well-nourished.     Comments: No acute distress  HENT:     Head: Normocephalic and atraumatic.     Ears:     Comments: Bilateral ears without tenderness to palpation of external auricle, tragus and mastoid, EAC's without erythema or swelling, TM's with good bony landmarks and cone of light. Non erythematous.     Nose: Nose normal.     Mouth/Throat:     Comments: Oral mucosa pink and moist, no tonsillar enlargement or exudate. Posterior pharynx patent and nonerythematous, no uvula deviation or swelling. Normal phonation. Eyes:     Conjunctiva/sclera: Conjunctivae normal.  Cardiovascular:     Rate and Rhythm: Normal rate.  Pulmonary:     Effort: Pulmonary effort is normal. No respiratory distress.     Comments:  Breathing comfortably at rest, CTABL, no wheezing, rales or other adventitious sounds auscultated Abdominal:     General: There is no distension.  Musculoskeletal:        General: Normal range of motion.     Cervical back: Neck supple.  Skin:    General: Skin is warm and dry.  Neurological:     Mental Status: He is alert and oriented to person, place, and time.  Psychiatric:        Mood and Affect: Mood and affect normal.      UC Treatments / Results  Labs (all labs ordered are listed, but only abnormal results are displayed) Labs Reviewed  NOVEL CORONAVIRUS, NAA    EKG   Radiology No results found.  Procedures Procedures (including critical care time)  Medications Ordered in UC Medications - No data to display  Initial Impression / Assessment and Plan / UC Course  I have reviewed the triage vital signs and the nursing notes.  Pertinent labs & imaging results that were available during my care of the patient were reviewed by me and considered in my medical decision making (see chart for details).     Covid test pending, 1 day of symptoms, recommending symptomatic and supportive care rest and fluids. Exam reassuring, low grade fever, possible Covid versus flu.  Push fluids.  Continue to monitor symptoms,Discussed strict return precautions. Patient verbalized understanding and is agreeable with plan.  Final Clinical Impressions(s) / UC Diagnoses   Final diagnoses:  Encounter for screening laboratory testing for COVID-19 virus  Influenza-like illness     Discharge Instructions     Covid test pending, monitor my chart for results Ibuprofen and Tylenol for fevers body aches headaches Zofran as needed for any further nausea and vomiting Please focus on drinking plenty of fluids and staying hydrated Tessalon for cough May use other over-the-counter medicine for cough and congestion Rest and fluids Follow-up if not improving or worsening    ED Prescriptions     Medication Sig Dispense Auth. Provider   ibuprofen (ADVIL) 800 MG tablet Take 1 tablet (800 mg total) by mouth 3 (three) times daily. 21 tablet Cellie Dardis C, PA-C   ondansetron (ZOFRAN ODT) 4 MG disintegrating tablet Take 1 tablet (4 mg total) by mouth every 8 (eight) hours as needed for nausea or vomiting. 20 tablet Jinny Sweetland C, PA-C   benzonatate (TESSALON) 200 MG capsule Take 1 capsule (200 mg total) by mouth 3 (three) times  daily as needed for up to 7 days for cough. 28 capsule Sydna Brodowski, [place] C, PA-C     PDMP not reviewed this encounter.   Lew Dawes, [place] 03/07/20 1629

## 2020-03-10 LAB — NOVEL CORONAVIRUS, NAA: SARS-CoV-2, NAA: DETECTED — AB

## 2020-11-20 ENCOUNTER — Other Ambulatory Visit: Payer: Self-pay

## 2020-11-20 ENCOUNTER — Ambulatory Visit
Admission: EM | Admit: 2020-11-20 | Discharge: 2020-11-20 | Disposition: A | Discharge status: Home / Self Care | Payer: Self-pay | Attending: Internal Medicine | Admitting: Internal Medicine

## 2020-11-20 ENCOUNTER — Encounter: Payer: Self-pay | Admitting: Emergency Medicine

## 2020-11-20 DIAGNOSIS — Z20822 Contact with and (suspected) exposure to covid-19: Secondary | ICD-10-CM

## 2020-11-20 DIAGNOSIS — J069 Acute upper respiratory infection, unspecified: Secondary | ICD-10-CM

## 2020-11-20 DIAGNOSIS — H65192 Other acute nonsuppurative otitis media, left ear: Secondary | ICD-10-CM

## 2020-11-20 MED ORDER — AMOXICILLIN 875 MG PO TABS: 875.0000 mg | ORAL_TABLET | Freq: Two times a day (BID) | ORAL | 0 refills | Status: AC

## 2020-11-20 NOTE — ED Triage Notes (Signed)
Patient c/o congestion, runny nose, SOB when waking up this am.  Slight cough.  Patient is not vaccinated for COVID.  No OTC meds.

## 2020-11-20 NOTE — ED Provider Notes (Signed)
[facility]    CSN: [id number] Arrival date & time: 11/20/20  0916      History   Chief Complaint Chief Complaint  Patient presents with   URI    HPI William Porter is a 30 y.o. male.   Patient presents with nasal congestion, runny nose, cough that started this morning.  Denies any known fevers or sick contacts.  Denies any chest pain or shortness of breath.  Patient has not yet taken any over-the-counter medications to help alleviate symptoms.   URI  History reviewed. No pertinent past medical history.  There are no problems to display for this patient.   History reviewed. No pertinent surgical history.     Home Medications    Prior to Admission medications   Medication Sig Start Date End Date Taking? Authorizing Provider  amoxicillin (AMOXIL) 875 MG tablet Take 1 tablet (875 mg total) by mouth 2 (two) times daily for 10 days. 11/20/20 11/30/20 Yes Lance Muss, FNP  ibuprofen (ADVIL) 800 MG tablet Take 1 tablet (800 mg total) by mouth 3 (three) times daily. 03/07/20   Wieters, Hallie C, PA-C  ondansetron (ZOFRAN ODT) 4 MG disintegrating tablet Take 1 tablet (4 mg total) by mouth every 8 (eight) hours as needed for nausea or vomiting. 03/07/20   Wieters, Junius Creamer, PA-C    Family History Family History  Problem Relation Age of Onset   Healthy Mother    Healthy Father     Social History Social History   Tobacco Use   Smoking status: Every Day    Types: Cigarettes   Smokeless tobacco: Never  Substance Use Topics   Alcohol use: Yes   Drug use: Yes    Types: Marijuana     Allergies   Bee venom   Review of Systems Review of Systems Per HPI  Physical Exam Triage Vital Signs ED Triage Vitals  Enc Vitals Group     BP 11/20/20 1111 (!) 143/87     Pulse Rate 11/20/20 1111 64     Resp 11/20/20 1111 18     Temp 11/20/20 1111 98.5 F (36.9 C)     Temp Source 11/20/20 1111 Oral     SpO2 11/20/20 1111 97 %     Weight 11/20/20  1112 238 lb (108 kg)     Height 11/20/20 1112 6\' 2"  (1.88 m)     Head Circumference --      Peak Flow --      Pain Score 11/20/20 1112 0     Pain Loc --      Pain Edu? --      Excl. in [place]? --    No data found.  Updated Vital Signs BP (!) 143/87 (BP Location: Left Arm)   Pulse 64   Temp 98.5 F (36.9 C) (Oral)   Resp 18   Ht 6\' 2"  (1.88 m)   Wt 238 lb (108 kg)   SpO2 97%   BMI 30.56 kg/m   Visual Acuity Right Eye Distance:   Left Eye Distance:   Bilateral Distance:    Right Eye Near:   Left Eye Near:    Bilateral Near:     Physical Exam Constitutional:      General: He is not in acute distress.    Appearance: Normal appearance.  HENT:     Head: Normocephalic and atraumatic.     Right Ear: Ear canal normal. Tympanic membrane is erythematous. Tympanic membrane is not perforated or bulging.  Left Ear: Tympanic membrane and ear canal normal.     Nose: Congestion present.     Mouth/Throat:     Mouth: Mucous membranes are moist.     Pharynx: Posterior oropharyngeal erythema present.  Eyes:     Extraocular Movements: Extraocular movements intact.     Conjunctiva/sclera: Conjunctivae normal.     Pupils: Pupils are equal, round, and reactive to light.  Cardiovascular:     Rate and Rhythm: Normal rate and regular rhythm.     Pulses: Normal pulses.     Heart sounds: Normal heart sounds.  Pulmonary:     Effort: Pulmonary effort is normal. No respiratory distress.     Breath sounds: Normal breath sounds. No wheezing.  Abdominal:     General: Abdomen is flat. Bowel sounds are normal.     Palpations: Abdomen is soft.  Musculoskeletal:        General: Normal range of motion.     Cervical back: Normal range of motion.  Skin:    General: Skin is warm and dry.  Neurological:     General: No focal deficit present.     Mental Status: He is alert and oriented to person, place, and time. Mental status is at baseline.  Psychiatric:        Mood and Affect: Mood normal.         Behavior: Behavior normal.     UC Treatments / Results  Labs (all labs ordered are listed, but only abnormal results are displayed) Labs Reviewed  NOVEL CORONAVIRUS, NAA    EKG   Radiology No results found.  Procedures Procedures (including critical care time)  Medications Ordered in UC Medications - No data to display  Initial Impression / Assessment and Plan / UC Course  I have reviewed the triage vital signs and the nursing notes.  Pertinent labs & imaging results that were available during my care of the patient were reviewed by me and considered in my medical decision making (see chart for details).     Will treat left otitis media with amoxicillin antibiotic. Patient also presents with symptoms likely from an upper respiratory infection. Differential includes bacterial pneumonia, sinusitis, allergic rhinitis, Covid 19. Do not suspect underlying cardiopulmonary process. Symptoms seem unlikely related to ACS, CHF or COPD exacerbations, pneumonia, pneumothorax. Patient is nontoxic appearing and not in need of emergent medical intervention.  Recommended symptom control with over the counter medications: Daily oral anti-histamine, Oral decongestant or IN corticosteroid, saline irrigations, cepacol lozenges, Robitussin, Delsym, honey tea.  Return if symptoms fail to improve in 1-2 weeks or you develop shortness of breath, chest pain, severe headache. Patient states understanding and is agreeable.  COVID-19 test is pending. Discussed strict return precautions. Patient verbalized understanding and is agreeable with plan.  Final Clinical Impressions(s) / UC Diagnoses   Final diagnoses:  Acute upper respiratory infection  Other non-recurrent acute nonsuppurative otitis media of left ear  Encounter for laboratory testing for COVID-19 virus     Discharge Instructions      You likely having an upper respiratory infection. We recommended symptom control. I expect your  symptoms to start improving in the next 1-2 weeks.   1. Take a daily allergy pill/anti-histamine like Zyrtec, Claritin, or Store brand consistently for 2 weeks  2. For congestion you may try an oral decongestant like Mucinex or sudafed. You may also try intranasal flonase nasal spray or saline irrigations (neti pot, sinus cleanse)  3. For your sore throat you may try  cepacol lozenges, salt water gargles, throat spray. Treatment of congestion may also help your sore throat.  4. For cough you may try Robitussen, Mucinex DM  5. Take Tylenol or Ibuprofen to help with pain/inflammation  6. Stay hydrated, drink plenty of fluids to keep throat coated and less irritated  Honey Tea For cough/sore throat try using a honey-based tea. Use 3 teaspoons of honey with juice squeezed from half lemon. Place shaved pieces of ginger into 1/2-1 cup of water and warm over stove top. Then mix the ingredients and repeat every 4 hours as needed.   You have also been prescribed amoxicillin antibiotic to treat your left ear infection.  Your COVID test is pending.  We will call if it is positive.     ED Prescriptions     Medication Sig Dispense Auth. Provider   amoxicillin (AMOXIL) 875 MG tablet Take 1 tablet (875 mg total) by mouth 2 (two) times daily for 10 days. 20 tablet Lance Muss, FNP      PDMP not reviewed this encounter.   Lance Muss, FNP 11/20/20 1223

## 2020-11-20 NOTE — Discharge Instructions (Signed)
You likely having an upper respiratory infection. We recommended symptom control. I expect your symptoms to start improving in the next 1-2 weeks.   1. Take a daily allergy pill/anti-histamine like Zyrtec, Claritin, or Store brand consistently for 2 weeks  2. For congestion you may try an oral decongestant like Mucinex or sudafed. You may also try intranasal flonase nasal spray or saline irrigations (neti pot, sinus cleanse)  3. For your sore throat you may try cepacol lozenges, salt water gargles, throat spray. Treatment of congestion may also help your sore throat.  4. For cough you may try Robitussen, Mucinex DM  5. Take Tylenol or Ibuprofen to help with pain/inflammation  6. Stay hydrated, drink plenty of fluids to keep throat coated and less irritated  Honey Tea For cough/sore throat try using a honey-based tea. Use 3 teaspoons of honey with juice squeezed from half lemon. Place shaved pieces of ginger into 1/2-1 cup of water and warm over stove top. Then mix the ingredients and repeat every 4 hours as needed.   You have also been prescribed amoxicillin antibiotic to treat your left ear infection.  Your COVID test is pending.  We will call if it is positive.

## 2020-11-21 LAB — SARS-COV-2, NAA 2 DAY TAT

## 2020-11-21 LAB — NOVEL CORONAVIRUS, NAA: SARS-CoV-2, NAA: NOT DETECTED

## 2021-03-11 ENCOUNTER — Emergency Department (HOSPITAL_COMMUNITY)
Admission: EM | Admit: 2021-03-11 | Discharge: 2021-03-11 | Disposition: A | Discharge status: Home / Self Care | Payer: Self-pay | Attending: Physician Assistant | Admitting: Physician Assistant

## 2021-03-11 ENCOUNTER — Other Ambulatory Visit: Payer: Self-pay

## 2021-03-11 ENCOUNTER — Encounter (HOSPITAL_COMMUNITY): Payer: Self-pay | Admitting: Emergency Medicine

## 2021-03-11 DIAGNOSIS — H9201 Otalgia, right ear: Secondary | ICD-10-CM | POA: Insufficient documentation

## 2021-03-11 MED ORDER — CIPROFLOXACIN-DEXAMETHASONE 0.3-0.1 % OT SUSP: 4.0000 [drp] | Freq: Once | OTIC | Status: DC

## 2021-03-11 MED ORDER — CIPROFLOXACIN-DEXAMETHASONE 0.3-0.1 % OT SUSP: 4.0000 [drp] | Freq: Two times a day (BID) | OTIC | 0 refills | Status: AC

## 2021-03-11 NOTE — ED Provider Notes (Signed)
[facility] EMERGENCY DEPARTMENT Provider Note   CSN: [id number] Arrival date & time: 03/11/21  0154     History  Chief Complaint  Patient presents with   Otalgia    Ulisses A Lowdermilk is a 31 y.o. male with no pertinent past medical history presents today for concern of the cotton off of the end of a Q-tip/cotton swab retained in his right ear.  He states that after shower he was using it to clean his ear, and he took it out and there was no cotton on the end of the swab.  He states that he tried to use the end of the swab to get the cotton out without success.  His ear feels uncomfortable and full.  He denies any fevers.  No hearing change.  HPI     Home Medications Prior to Admission medications   Medication Sig Start Date End Date Taking? Authorizing Provider  ciprofloxacin-dexamethasone (CIPRODEX) OTIC suspension Place 4 drops into the right ear 2 (two) times daily for 7 days. 03/11/21 03/18/21 Yes Cristina Gong, PA-C  ibuprofen (ADVIL) 800 MG tablet Take 1 tablet (800 mg total) by mouth 3 (three) times daily. 03/07/20   Wieters, Hallie C, PA-C  ondansetron (ZOFRAN ODT) 4 MG disintegrating tablet Take 1 tablet (4 mg total) by mouth every 8 (eight) hours as needed for nausea or vomiting. 03/07/20   Wieters, Hallie C, PA-C      Allergies    Bee venom    Review of Systems   Review of Systems  Constitutional:  Negative for chills and fever.  HENT:  Positive for ear pain (Feeling full). Negative for ear discharge, facial swelling and hearing loss.   All other systems reviewed and are negative.  Physical Exam Updated Vital Signs BP 126/89    Pulse 71    Temp 98.7 F (37.1 C)    Resp 18    SpO2 95%  Physical Exam Vitals and nursing note reviewed.  Constitutional:      General: He is not in acute distress. HENT:     Head: Normocephalic and atraumatic.     Left Ear: Tympanic membrane, ear canal and external ear normal.     Ears:     Comments: Right  ear: External ear is normal.  Ear canal is slightly edematous with superficial excoriation.  TM is intact, pearly gray.  No foreign body is visualized.    Nose: Nose normal.  Cardiovascular:     Rate and Rhythm: Normal rate.  Pulmonary:     Effort: Pulmonary effort is normal. No respiratory distress.  Musculoskeletal:     Cervical back: No rigidity.  Neurological:     Mental Status: He is alert. Mental status is at baseline.     Comments: Awake and alert, answers all questions appropriately.  Speech is not slurred.    Psychiatric:        Mood and Affect: Mood normal.    ED Results / Procedures / Treatments   Labs (all labs ordered are listed, but only abnormal results are displayed) Labs Reviewed - No data to display  EKG None  Radiology No results found.  Procedures Procedures    Medications Ordered in ED Medications - No data to display  ED Course/ Medical Decision Making/ A&P                           Medical Decision Making  Patient presents today for  concern of retained foreign body in his right ear. He does have mild canal edema, however I do not see any retained foreign body in his right ear. His canal does appear mildly edematous, with excoriations, I suspect this is from him attempting to remove what he thought was a foreign body in his ear. Will place on Ciprodex drops. We did discuss that there is a small chance that with his canal edema he may have a very small foreign body in a area I am unable to visualize and states his understanding of this.  He is given ENT follow-up in case his symptoms persist. He is advised not to put anything in his ears.  Given his excoriations and edema will give him Ciprodex drops to help prevent developing otitis externa.  No imaging or labs indicated. No family at bedside to discuss patients care with.    Return precautions were discussed with patient who states their understanding.  At the time of discharge patient denied  any unaddressed complaints or concerns.  Patient is agreeable for discharge home.  Note: Portions of this report may have been transcribed using voice recognition software. Every effort was made to ensure accuracy; however, inadvertent computerized transcription errors may be present  Final Clinical Impression(s) / ED Diagnoses Final diagnoses:  Right ear pain    Rx / DC Orders ED Discharge Orders          Ordered    ciprofloxacin-dexamethasone (CIPRODEX) OTIC suspension  2 times daily        03/11/21 0251              Cristina Gong, PA-C 03/11/21 0441    Wilkie Aye Mayer Masker, MD 03/12/21 [phone]

## 2021-03-11 NOTE — ED Triage Notes (Signed)
Patient here with possible qtip in ear.  Patient to ear.

## 2021-03-11 NOTE — Discharge Instructions (Addendum)
As we discussed today I did not see any cotton or other foreign body in your right ear, however you do have some swelling which may be limiting my ability to see a small foreign body. Please use the eardrops. Please do not put anything in your ear including Q-tips.  You should never be putting anything into the ear canal. If you continue to have symptoms you can schedule a follow-up appointment with the ear nose and throat doctor.

## 2021-03-11 NOTE — ED Provider Notes (Signed)
Emergency Medicine Provider Triage Evaluation Note  Antero A Ouderkirk , a 31 y.o. male  was evaluated in triage.  Pt complains of right ear pain.  He reports that he was using a qtip to try and clean out his ear and he got some of it to come off in his right ear shortly PTA.  He thinks he has cotton stuck in his right ear.  Review of Systems  Positive:  Negative: See above  Physical Exam  BP 126/89    Pulse 71    Temp 98.7 F (37.1 C)    Resp 18    SpO2 95%  Gen:   Awake, no distress   Resp:  Normal effort  MSK:   Moves extremities without difficulty  Other:  No foreign object visualized in right ear.  TM is intact.   Medical Decision Making  Medically screening exam initiated at 2:46 AM.  Appropriate orders placed.  Sammy A Harmes was informed that the remainder of the evaluation will be completed by another provider, this initial triage assessment does not replace that evaluation, and the importance of remaining in the ED until their evaluation is complete.     Cristina Gong, [place] 03/11/21 4193    Shon Baton, MD 03/11/21 [phone]

## 2021-05-29 ENCOUNTER — Emergency Department (HOSPITAL_COMMUNITY): Payer: Self-pay

## 2021-05-29 ENCOUNTER — Encounter (HOSPITAL_COMMUNITY): Payer: Self-pay

## 2021-05-29 ENCOUNTER — Other Ambulatory Visit: Payer: Self-pay

## 2021-05-29 ENCOUNTER — Emergency Department (HOSPITAL_COMMUNITY)
Admission: EM | Admit: 2021-05-29 | Discharge: 2021-05-29 | Disposition: A | Discharge status: Home / Self Care | Payer: Self-pay | Attending: Emergency Medicine | Admitting: Emergency Medicine

## 2021-05-29 DIAGNOSIS — Y9301 Activity, walking, marching and hiking: Secondary | ICD-10-CM | POA: Insufficient documentation

## 2021-05-29 DIAGNOSIS — M79672 Pain in left foot: Secondary | ICD-10-CM

## 2021-05-29 DIAGNOSIS — S91332A Puncture wound without foreign body, left foot, initial encounter: Secondary | ICD-10-CM | POA: Insufficient documentation

## 2021-05-29 DIAGNOSIS — T148XXA Other injury of unspecified body region, initial encounter: Secondary | ICD-10-CM

## 2021-05-29 DIAGNOSIS — Z23 Encounter for immunization: Secondary | ICD-10-CM | POA: Insufficient documentation

## 2021-05-29 DIAGNOSIS — W450XXA Nail entering through skin, initial encounter: Secondary | ICD-10-CM | POA: Insufficient documentation

## 2021-05-29 MED ORDER — IBUPROFEN 400 MG PO TABS
600.0000 mg | ORAL_TABLET | Freq: Once | ORAL | Status: AC
  Administered 2021-05-29: 600 mg via ORAL
  Filled 2021-05-29: qty 1

## 2021-05-29 MED ORDER — IBUPROFEN 600 MG PO TABS
600.0000 mg | ORAL_TABLET | Freq: Four times a day (QID) | ORAL | 0 refills | Status: AC | PRN
Start: 2021-05-29 — End: ?

## 2021-05-29 MED ORDER — TETANUS-DIPHTH-ACELL PERTUSSIS 5-2.5-18.5 LF-MCG/0.5 IM SUSY
0.5000 mL | PREFILLED_SYRINGE | Freq: Once | INTRAMUSCULAR | Status: AC
  Administered 2021-05-29: 0.5 mL via INTRAMUSCULAR
  Filled 2021-05-29: qty 0.5

## 2021-05-29 MED ORDER — CIPROFLOXACIN HCL 750 MG PO TABS: 750.0000 mg | ORAL_TABLET | Freq: Two times a day (BID) | ORAL | 0 refills | Status: DC

## 2021-05-29 MED ORDER — CIPROFLOXACIN HCL 500 MG PO TABS
750.0000 mg | ORAL_TABLET | Freq: Once | ORAL | Status: AC
Start: 2021-05-29 — End: 2021-05-29
  Administered 2021-05-29: 750 mg via ORAL
  Filled 2021-05-29: qty 2

## 2021-05-29 NOTE — Discharge Instructions (Signed)
Please read and follow all provided instructions. ? ?Your diagnoses today include:  ?1. Puncture wound   ?2. Left foot pain   ? ? ?Tests performed today include: ?An x-ray of the affected area - does NOT show any broken bones ?Vital signs. See below for your results today.  ? ?Medications prescribed:  ?Ciprofloxacin - antibiotic ? ?You have been prescribed an antibiotic medicine: take the entire course of medicine even if you are feeling better. Stopping early can cause the antibiotic not to work. ? ?Ibuprofen (Motrin, Advil) - anti-inflammatory pain medication ?Do not exceed 600mg  ibuprofen every 6 hours, take with food ? ?You have been prescribed an anti-inflammatory medication or NSAID. Take with food. Take smallest effective dose for the shortest duration needed for your pain. Stop taking if you experience stomach pain or vomiting.  ? ?Take any prescribed medications only as directed. ? ?Home care instructions:  ?Follow any educational materials contained in this packet ?Follow R.I.C.E. Protocol: ?R - rest your injury ? I  - use ice on injury without applying directly to skin ?C - compress injury with bandage or splint ?E - elevate the injury as much as possible ? ?Follow-up instructions: ?Please follow-up with your primary care provider if you continue to have significant pain in 1 week. In this case you may have a more severe injury that requires further care.  ? ?Return instructions:  ?Please return if your toes or feet are numb or tingling, appear gray or blue, or you have severe pain (also elevate the leg and loosen splint or wrap if you were given one) ?Return with worsening pain, worsening swelling, expanding area of redness or streaking up extremity, fever, or any other concerns.  ?Please return to the Emergency Department if you experience worsening symptoms.  ?Please return if you have any other emergent concerns. ? ?Additional Information: ? ?Your vital signs today were: ?BP 135/90   Pulse 81   Temp  98.2 ?F (36.8 ?C) (Oral)   Resp 16   Ht 6\' 2"  (1.88 m)   Wt 113.4 kg   SpO2 97%   BMI 32.10 kg/m?  ?If your blood pressure (BP) was elevated above 135/85 this visit, please have this repeated by your doctor within one month. ?-------------- ? ?

## 2021-05-29 NOTE — ED Triage Notes (Signed)
Pt reports he stepped on nail about 3 hours PTA. Minimal visible wound to sole of left foot. Last tetanus unknown. Ambulatory with independent steady gait. ?

## 2021-05-29 NOTE — ED Provider Notes (Signed)
?[facility] ?Provider Note ? ? ?CSN: [id number] ?Arrival date & time: 05/29/21  0112 ? ?  ? ?History ? ?Chief Complaint  ?Patient presents with  ? Foot Injury  ? ? ?William Porter is a 31 y.o. male. ? ?Patient presents to the emergency department for evaluation of puncture wound to the sole of the left foot.  Patient states that he was walking into his house when he stepped on a board that had a protruding nail.  It went through his shoe and the nail embedded in his shoe.  He sustained a small puncture at the base of his toes.  No treatments prior to arrival.  He states that the pain is worse when he puts pressure on the area.  No history of diabetes.  Last tetanus unknown.  Denies other injuries. ? ? ?  ? ?Home Medications ?Prior to Admission medications   ?Medication Sig Start Date End Date Taking? Authorizing Provider  ?ciprofloxacin (CIPRO) 750 MG tablet Take 1 tablet (750 mg total) by mouth 2 (two) times daily. 05/29/21  Yes Carlisle Cater, PA-C  ?ibuprofen (ADVIL) 600 MG tablet Take 1 tablet (600 mg total) by mouth every 6 (six) hours as needed. 05/29/21  Yes Carlisle Cater, PA-C  ?ondansetron (ZOFRAN ODT) 4 MG disintegrating tablet Take 1 tablet (4 mg total) by mouth every 8 (eight) hours as needed for nausea or vomiting. 03/07/20   Wieters, Hallie C, PA-C  ?   ? ?Allergies    ?Bee venom   ? ?Review of Systems   ?Review of Systems ? ?Physical Exam ?Updated Vital Signs ?BP 135/90   Pulse 81   Temp 98.2 ?F (36.8 ?C) (Oral)   Resp 16   Ht 6\' 2"  (1.88 m)   Wt 113.4 kg   SpO2 97%   BMI 32.10 kg/m?  ? ?Physical Exam ?Vitals and nursing note reviewed.  ?Constitutional:   ?   Appearance: He is well-developed.  ?HENT:  ?   Head: Normocephalic and atraumatic.  ?Eyes:  ?   Conjunctiva/sclera: Conjunctivae normal.  ?Pulmonary:  ?   Effort: No respiratory distress.  ?Musculoskeletal:  ?   Cervical back: Normal range of motion and neck supple.  ?   Comments: Full range of  motion of the toes, foot, and ankle.  There is a small puncture wound on the sole of the foot proximal to the third/fourth toe area.  Surrounding tissue is quite tender.  No significant swelling or signs of cellulitis.  Patient has signs of tinea pedis and nail infection.  ?Skin: ?   General: Skin is warm and dry.  ?Neurological:  ?   Mental Status: He is alert.  ? ? ?ED Results / Procedures / Treatments   ?Labs ?(all labs ordered are listed, but only abnormal results are displayed) ?Labs Reviewed - No data to display ? ?EKG ?None ? ?Radiology ?DG Foot Complete Left ? ?Result Date: 05/29/2021 ?CLINICAL DATA:  stepped on nail EXAM: LEFT FOOT - COMPLETE 3+ VIEW COMPARISON:  None. FINDINGS: There is no evidence of fracture or dislocation. There is no evidence of arthropathy or other focal bone abnormality. Soft tissues are unremarkable. No retained radiopaque foreign body. IMPRESSION: Negative. Electronically Signed   By: Iven Finn M.D.   On: 05/29/2021 01:59   ? ?Procedures ?Procedures  ? ? ?Medications Ordered in ED ?Medications  ?ciprofloxacin (CIPRO) tablet 750 mg (750 mg Oral Given 05/29/21 0218)  ?Tdap (BOOSTRIX) injection 0.5 mL (0.5 mLs Intramuscular  Given 05/29/21 0219)  ?ibuprofen (ADVIL) tablet 600 mg (600 mg Oral Given 05/29/21 0218)  ? ? ?ED Course/ Medical Decision Making/ A&P ?  ? ?Patient seen and examined. History obtained directly from patient.  ? ?Imaging: Independently reviewed and interpreted.  This included: X-ray of the foot, agree negative, no signs of foreign body ? ?Medications/Fluids: Ordered: Tetanus booster, ciprofloxacin due to the risk of Pseudomonas given puncture wound through sole of shoe, ibuprofen for pain ? ?Most recent vital signs reviewed and are as follows: ?BP 135/90   Pulse 80   Temp 98.3 ?F (36.8 ?C) (Oral)   Resp 17   Ht 6\' 2"  (1.88 m)   Wt 113.4 kg   SpO2 98%   BMI 32.10 kg/m?  ? ?Initial impression: Puncture wound to foot ? ?Home treatment plan: RICE protocol, good  wound care, antibiotics ? ?Return instructions discussed with patient: Pt urged to return with worsening pain, worsening swelling, expanding area of redness or streaking up extremity, fever, or any other concerns.  Urged to take complete course of antibiotics as prescribed. Counseled to take pain medications as prescribed. Pt verbalizes understanding and agrees with plan.  Work note given. ? ?Follow-up instructions discussed with patient: Encourage PCP follow-up as needed ? ?                        ?Medical Decision Making ?Amount and/or Complexity of Data Reviewed ?Radiology: ordered. ? ?Risk ?Prescription drug management. ? ? ?Patient with puncture wound through sole of shoe.  Initiated therapy on Cipro due to possibility of Pseudomonas.  Otherwise no active infection.  X-ray negative for foreign body.  Pain control with ibuprofen, tetanus updated. ? ? ? ? ? ? ? ?Final Clinical Impression(s) / ED Diagnoses ?Final diagnoses:  ?Puncture wound  ?Left foot pain  ? ? ?Rx / DC Orders ?ED Discharge Orders   ? ?      Ordered  ?  ciprofloxacin (CIPRO) 750 MG tablet  2 times daily       ? 05/29/21 0220  ?  ibuprofen (ADVIL) 600 MG tablet  Every 6 hours PRN       ? 05/29/21 0220  ? ?  ?  ? ?  ? ? ?  ?Carlisle Cater, PA-C ?05/29/21 [id number] ? ?  ?Orpah Greek, MD ?05/29/21 [phone] ? ?

## 2022-09-19 ENCOUNTER — Other Ambulatory Visit: Payer: Self-pay

## 2022-09-19 ENCOUNTER — Encounter (HOSPITAL_COMMUNITY): Payer: Self-pay | Admitting: Emergency Medicine

## 2022-09-19 ENCOUNTER — Ambulatory Visit (HOSPITAL_COMMUNITY)
Admission: EM | Admit: 2022-09-19 | Discharge: 2022-09-19 | Disposition: A | Discharge status: Home / Self Care | Payer: Self-pay | Attending: Family Medicine | Admitting: Family Medicine

## 2022-09-19 DIAGNOSIS — S4990XA Unspecified injury of shoulder and upper arm, unspecified arm, initial encounter: Secondary | ICD-10-CM

## 2022-09-19 DIAGNOSIS — M79622 Pain in left upper arm: Secondary | ICD-10-CM

## 2022-09-19 MED ORDER — AMOXICILLIN-POT CLAVULANATE 875-125 MG PO TABS: 1.0000 | ORAL_TABLET | Freq: Two times a day (BID) | ORAL | 0 refills | Status: DC

## 2022-09-19 MED ORDER — LIDOCAINE-EPINEPHRINE 1 %-1:100000 IJ SOLN
INTRAMUSCULAR | Status: AC
  Filled 2022-09-19: qty 1

## 2022-09-19 NOTE — ED Triage Notes (Signed)
Patient has a fish hook in posterior left arm.  Occurred approximately one hour ago.    Patient thinks last tetanus was last year

## 2022-09-19 NOTE — ED Provider Notes (Signed)
[facility] CARE CENTER   [id number] 09/19/22 Arrival Time: 1207  ASSESSMENT & PLAN:  1. Left upper arm pain   2. Fish hook in upper arm    Foreign Body Removal Procedure Note  Anesthesia: 1% lidocaine with epinephrine  Procedure Details  The procedure, risks and complications have been discussed in detail (including, but not limited to pain and bleeding) with the patient. Verbal consent obtained.  The skin induration was prepped and draped in the usual fashion. After adequate local anesthesia, a small incision made over skin with embedded fish hook using a #11 blade; hemostat used to grab fish hook and push forward through incisional opening; successful removal. Single 3-0 silk suture to close incision.  EBL: minimal Drains: none Packing: n/a Condition: Tolerated procedure well Complications: none.  Meds ordered this encounter  Medications   amoxicillin-clavulanate (AUGMENTIN) 875-125 MG tablet    Sig: Take 1 tablet by mouth every 12 (twelve) hours.    Dispense:  14 tablet    Refill:  0   OTC ibuprofen as needed. See AVS for d/c instructions/information.   Follow-up Information     [facility] at [facility].   Specialty: Urgent Care Why: In 7 days for suture removal, sooner if needed. Contact information: [address] [place] [address] [phone]               Reviewed expectations re: course of current medical issues. Questions answered. Outlined signs and symptoms indicating need for more acute intervention. Patient verbalized understanding. After Visit Summary given.   SUBJECTIVE:  William Porter is a 32 y.o. male who presents with a fish hook embedded in LEFT upper arm; today; 1-2 hours ago. Is painful. No extremity sensation changes or weakness. No treatment PTA. TD within 1-2 years.  OBJECTIVE:  Vitals:   09/19/22 1243  BP: (!) 146/89  Pulse: 65  Resp: 20  Temp: 98.8 F (37.1 C)  TempSrc: Oral   SpO2: 97%     General appearance: alert; no distress LUE: embedded fish hook over lateral upper ext; ender to touch; no active drainage or bleeding Psychological: alert and cooperative; normal mood and affect  Allergies  Allergen Reactions   Bee Venom     History reviewed. No pertinent past medical history. Social History   Socioeconomic History   Marital status: Single    Spouse name: Not on file   Number of children: Not on file   Years of education: Not on file   Highest education level: Not on file  Occupational History   Not on file  Tobacco Use   Smoking status: Every Day    Types: Cigarettes   Smokeless tobacco: Never  Vaping Use   Vaping status: Never Used  Substance and Sexual Activity   Alcohol use: Yes   Drug use: Yes    Types: Marijuana   Sexual activity: Not on file  Other Topics Concern   Not on file  Social History Narrative   Not on file   Social Determinants of Health   Financial Resource Strain: Not on file  Food Insecurity: Not on file  Transportation Needs: Not on file  Physical Activity: Not on file  Stress: Not on file  Social Connections: Not on file   Family History  Problem Relation Age of Onset   Healthy Mother    Healthy Father    History reviewed. No pertinent surgical history.          Mardella Layman, MD 09/19/22  1338  

## 2022-09-19 NOTE — Discharge Instructions (Signed)
If not allergic, you may use over the counter ibuprofen or acetaminophen as needed. ° °

## 2023-04-16 IMAGING — DX DG FOOT COMPLETE 3+V*L*
3 series · 3 of 3 positions shown · non-contrast
Comparison: None.

CLINICAL DATA: stepped on nail

EXAM:
LEFT FOOT - COMPLETE 3+ VIEW

[foot ap]
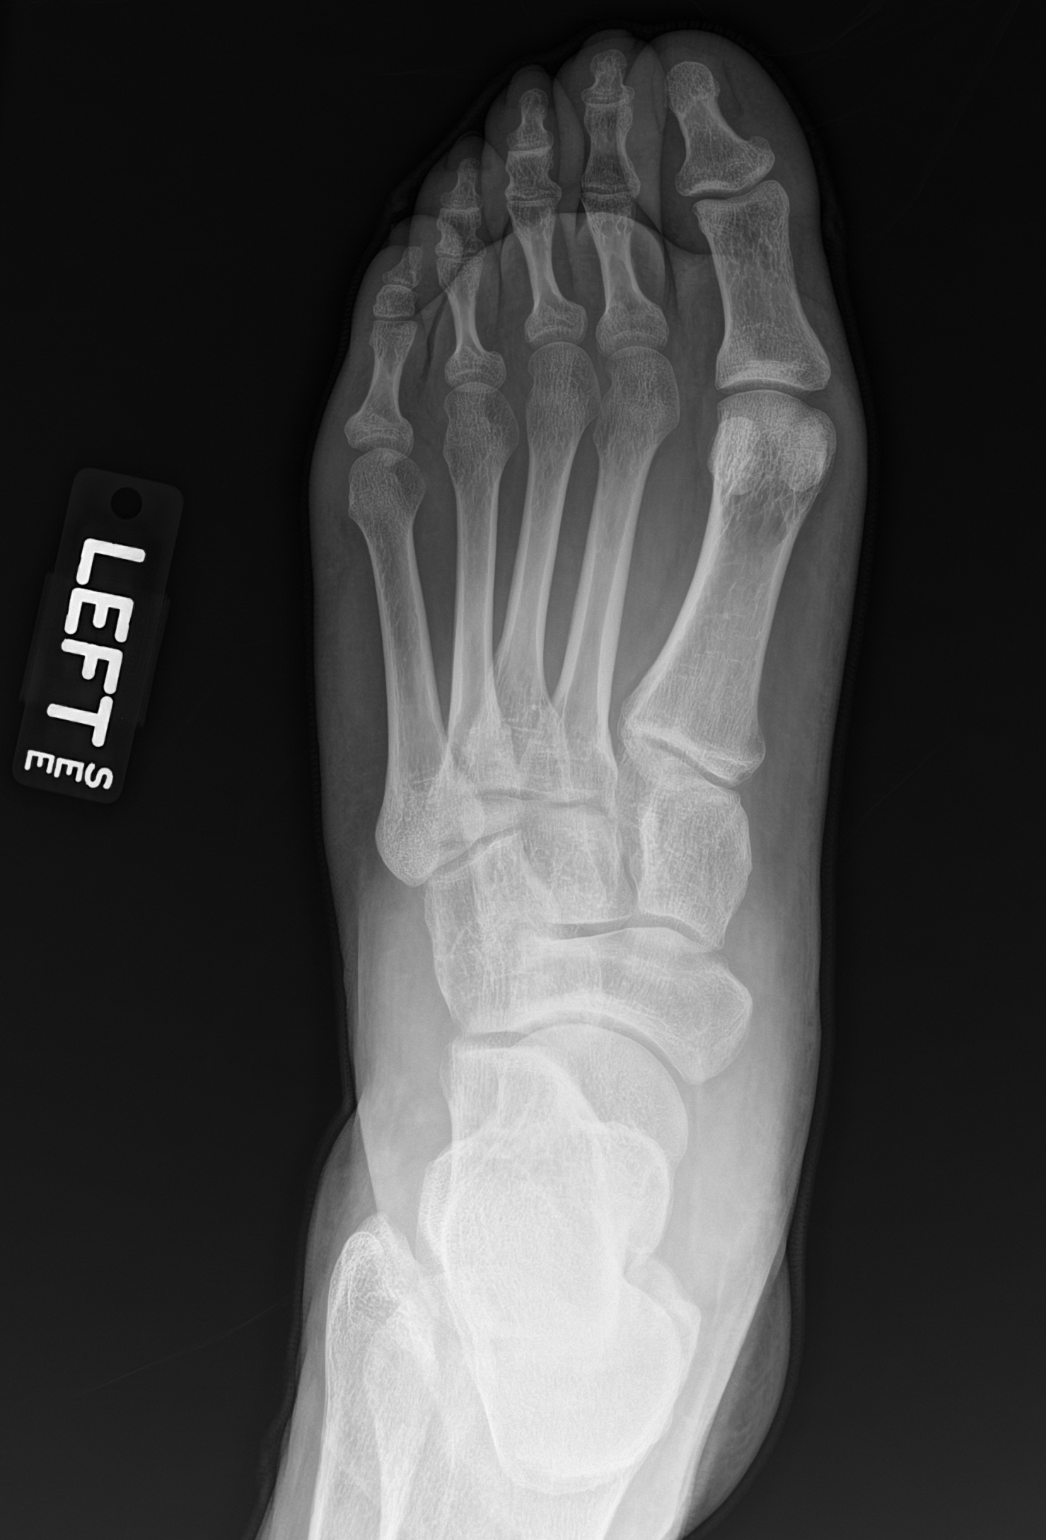

[foot obl]
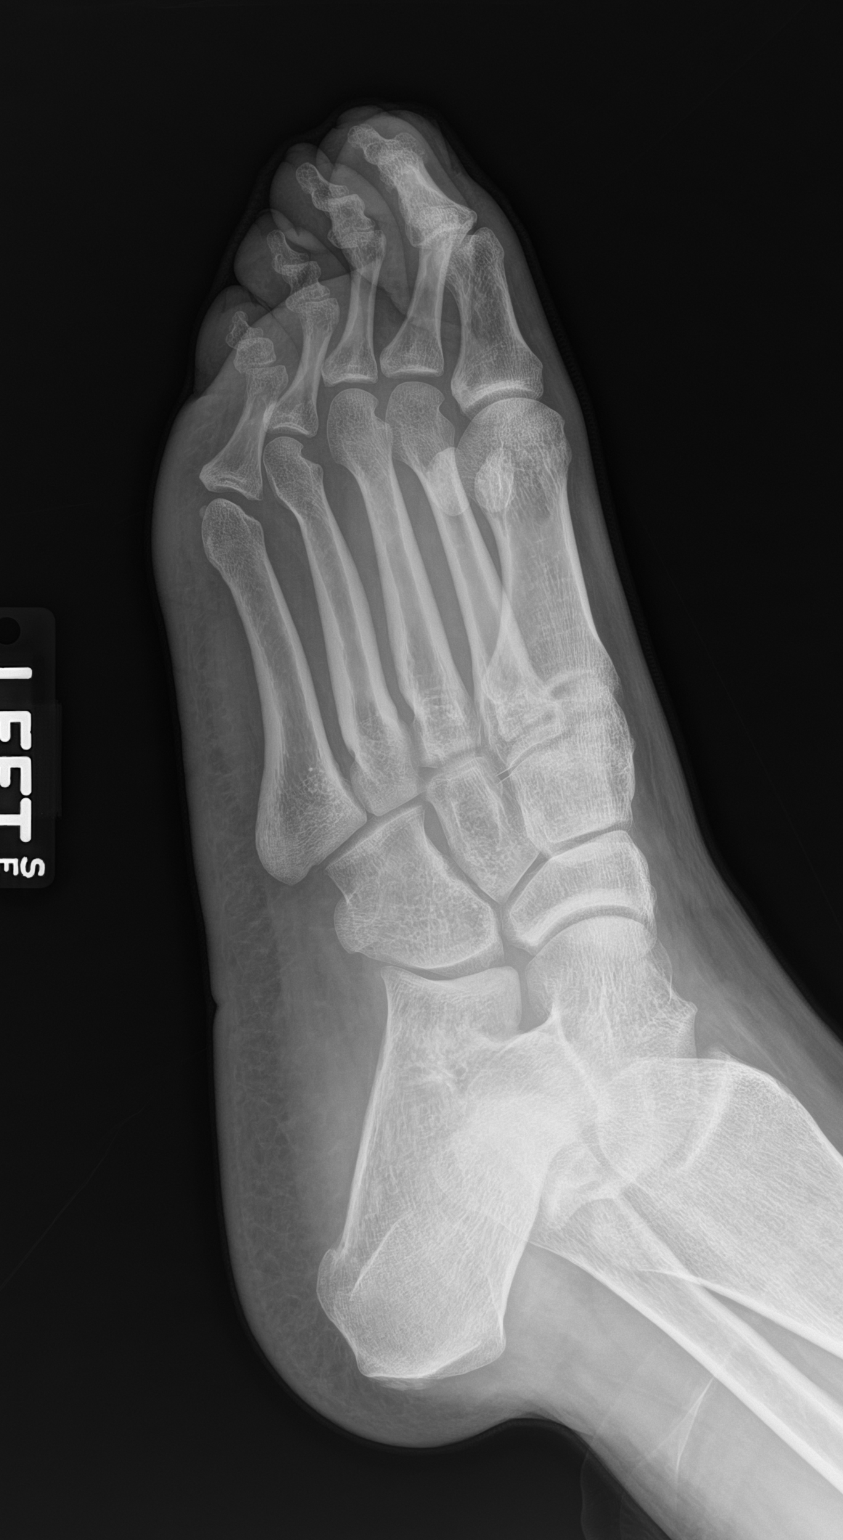

[foot lat]
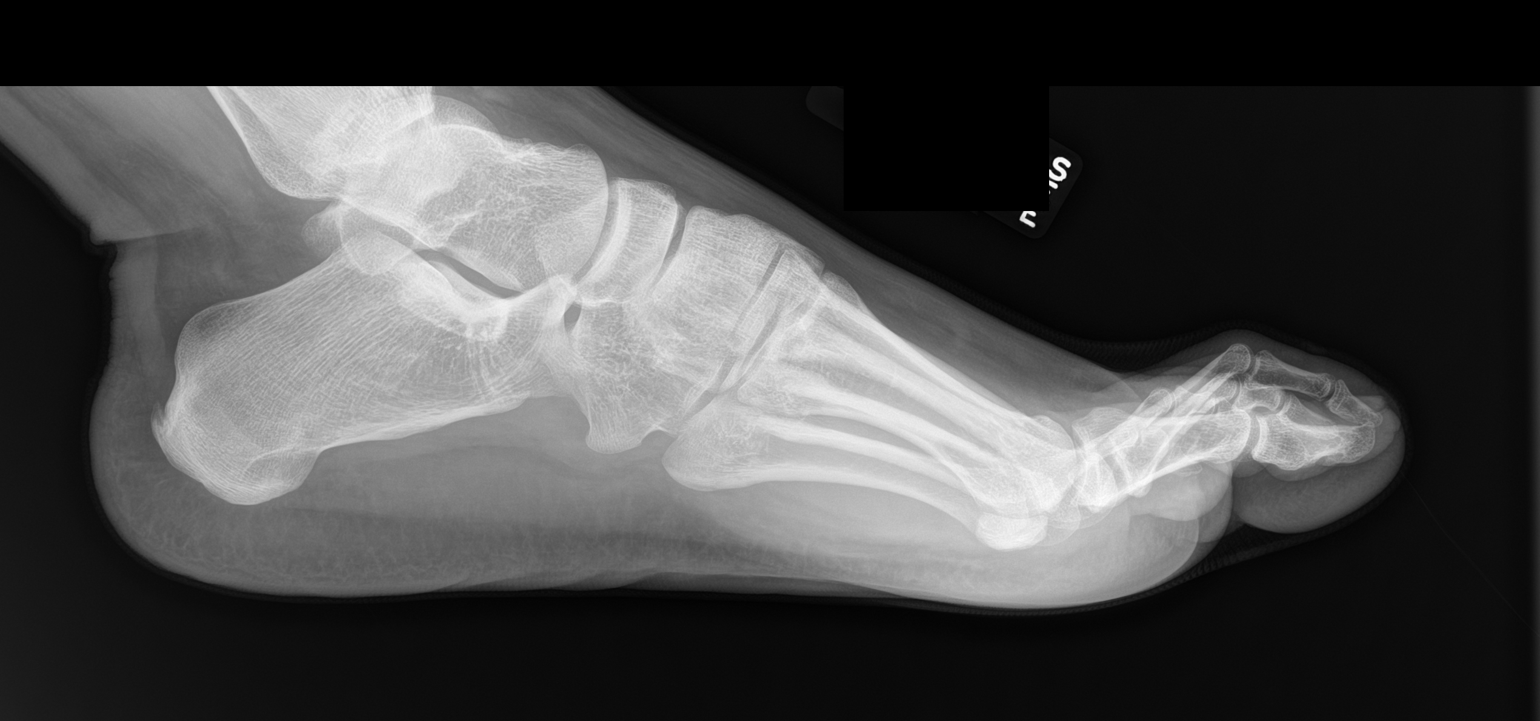

[3 of 3 positions shown; findings below may reference images not displayed]

FINDINGS: There is no evidence of fracture or dislocation. There is no
evidence of arthropathy or other focal bone abnormality. Soft
tissues are unremarkable. No retained radiopaque foreign body.
IMPRESSION: Negative.

## 2023-05-20 ENCOUNTER — Telehealth (INDEPENDENT_AMBULATORY_CARE_PROVIDER_SITE_OTHER): Payer: Self-pay

## 2023-05-20 NOTE — Telephone Encounter (Signed)
 Called pt to confirm appt. Pt's phone is unavailable.

## 2023-05-21 ENCOUNTER — Ambulatory Visit (INDEPENDENT_AMBULATORY_CARE_PROVIDER_SITE_OTHER): Payer: Self-pay | Admitting: Primary Care

## 2023-05-21 ENCOUNTER — Encounter (INDEPENDENT_AMBULATORY_CARE_PROVIDER_SITE_OTHER): Payer: Self-pay | Admitting: Primary Care

## 2023-05-21 VITALS — BP 125/71 | HR 74 | Ht 74.0 in | Wt 248.4 lb

## 2023-05-21 DIAGNOSIS — E559 Vitamin D deficiency, unspecified: Secondary | ICD-10-CM

## 2023-05-21 DIAGNOSIS — Z6831 Body mass index (BMI) 31.0-31.9, adult: Secondary | ICD-10-CM

## 2023-05-21 DIAGNOSIS — E66811 Obesity, class 1: Secondary | ICD-10-CM

## 2023-05-21 DIAGNOSIS — Z720 Tobacco use: Secondary | ICD-10-CM

## 2023-05-21 DIAGNOSIS — Z114 Encounter for screening for human immunodeficiency virus [HIV]: Secondary | ICD-10-CM

## 2023-05-21 DIAGNOSIS — Z131 Encounter for screening for diabetes mellitus: Secondary | ICD-10-CM

## 2023-05-21 DIAGNOSIS — F17210 Nicotine dependence, cigarettes, uncomplicated: Secondary | ICD-10-CM

## 2023-05-21 DIAGNOSIS — Z13220 Encounter for screening for lipoid disorders: Secondary | ICD-10-CM

## 2023-05-21 DIAGNOSIS — Z7689 Persons encountering health services in other specified circumstances: Secondary | ICD-10-CM

## 2023-05-21 DIAGNOSIS — Z1159 Encounter for screening for other viral diseases: Secondary | ICD-10-CM

## 2023-05-21 DIAGNOSIS — E6609 Other obesity due to excess calories: Secondary | ICD-10-CM

## 2023-05-21 NOTE — Patient Instructions (Signed)
 Obesity, Adult Obesity is having too much body fat. Being obese means that your weight is more than what is healthy for you.  BMI (body mass index) is a number that explains how much body fat you have. If you have a BMI of 30 or more, you are obese. Obesity can cause serious health problems, such as: Stroke. Coronary artery disease (CAD). Type 2 diabetes. Some types of cancer. High blood pressure (hypertension). High cholesterol. Gallbladder stones. Obesity can also contribute to: Osteoarthritis. Sleep apnea. Infertility problems. What are the causes? Eating meals each day that are high in calories, sugar, and fat. Drinking a lot of drinks that have sugar in them. Being born with genes that may make you more likely to become obese. Having a medical condition that causes obesity. Taking certain medicines. Sitting a lot (having a sedentary lifestyle). Not getting enough sleep. What increases the risk? Having a family history of obesity. Living in an area with limited access to: [place], recreation centers, or sidewalks. Healthy food choices, such as grocery stores and farmers' markets. What are the signs or symptoms? The main sign is having too much body fat. How is this treated? Treatment for this condition often includes changing your lifestyle. Treatment may include: Changing your diet. This may include making a healthy meal plan. Exercise. This may include activity that causes your heart to beat faster (aerobic exercise) and strength training. Work with your doctor to design a program that works for you. Medicine to help you lose weight. This may be used if you are not able to lose one pound a week after 6 weeks of healthy eating and more exercise. Treating conditions that cause the obesity. Surgery. Options may include gastric banding and gastric bypass. This may be done if: Other treatments have not helped to improve your condition. You have a BMI of 40 or higher. You have  life-threatening health problems related to obesity. Follow these instructions at home: Eating and drinking  Follow advice from your doctor about what to eat and drink. Your doctor may tell you to: Limit fast food, sweets, and processed snack foods. Choose low-fat options. For example, choose low-fat milk instead of whole milk. Eat five or more servings of fruits or vegetables each day. Eat at home more often. This gives you more control over what you eat. Choose healthy foods when you eat out. Learn to read food labels. This will help you learn how much food is in one serving. Keep low-fat snacks available. Avoid drinks that have a lot of sugar in them. These include soda, fruit juice, iced tea with sugar, and flavored milk. Drink enough water to keep your pee (urine) pale yellow. Do not go on fad diets. Physical activity Exercise often, as told by your doctor. Most adults should get up to 150 minutes of moderate-intensity exercise every week.Ask your doctor: What types of exercise are safe for you. How often you should exercise. Warm up and stretch before being active. Do slow stretching after being active (cool down). Rest between times of being active. Lifestyle Work with your doctor and a food expert (dietitian) to set a weight-loss goal that is best for you. Limit your screen time. Find ways to reward yourself that do not involve food. Do not drink alcohol if: Your doctor tells you not to drink. You are pregnant, may be pregnant, or are planning to become pregnant. If you drink alcohol: Limit how much you have to: 0-1 drink a day for women. 0-2 drinks  a day for men. Know how much alcohol is in your drink. In the [place] one drink equals one 12 oz bottle of beer (355 mL), one 5 oz glass of wine (148 mL), or one 1 oz glass of hard liquor (44 mL). General instructions Keep a weight-loss journal. This can help you keep track of: The food that you eat. How much exercise you  get. Take over-the-counter and prescription medicines only as told by your doctor. Take vitamins and supplements only as told by your doctor. Think about joining a support group. Pay attention to your mental health as obesity can lead to depression or self esteem issues. Keep all follow-up visits. Contact a doctor if: You cannot meet your weight-loss goal after you have changed your diet and lifestyle for 6 weeks. You are having trouble breathing. Summary Obesity is having too much body fat. Being obese means that your weight is more than what is healthy for you. Work with your doctor to set a weight-loss goal. Get regular exercise as told by your doctor. This information is not intended to replace advice given to you by your health care provider. Make sure you discuss any questions you have with your health care provider. Document Revised: 10/01/2020 Document Reviewed: 10/01/2020 Elsevier Patient Education  2024 [employer].

## 2023-05-21 NOTE — Progress Notes (Signed)
 New Patient Office Visit  Subjective    Patient ID: William Porter male  DOB: 06/16/1990  Age: 33 y.o. MRN: [id number]   CC:  Establish care    HPI  Mr. William Porter is a 33 year old obese male in today to establish care.  He voices no complaints or concerns. Current Outpatient Medications on File Prior to Visit  Medication Sig Dispense Refill   amoxicillin-clavulanate (AUGMENTIN) 875-125 MG tablet Take 1 tablet by mouth every 12 (twelve) hours. (Patient not taking: Reported on 05/21/2023) 14 tablet 0   ibuprofen (ADVIL) 600 MG tablet Take 1 tablet (600 mg total) by mouth every 6 (six) hours as needed. (Patient not taking: Reported on 05/21/2023) 20 tablet 0   No current facility-administered medications on file prior to visit.     Allergies  Allergen Reactions   Bee Venom     Past Medical History:  Diagnosis Date   No pertinent past medical history      Past Surgical History:  Procedure Laterality Date   NO PAST SURGERY       Family History  Problem Relation Age of Onset   Healthy Mother    Healthy Father     Social History   Socioeconomic History   Marital status: Single    Spouse name: Not on file   Number of children: Not on file   Years of education: Not on file   Highest education level: Not on file  Occupational History   Not on file  Tobacco Use   Smoking status: Every Day    Types: Cigarettes   Smokeless tobacco: Never  Vaping Use   Vaping status: Never Used  Substance and Sexual Activity   Alcohol use: Yes   Drug use: Yes    Types: Marijuana   Sexual activity: Not on file  Other Topics Concern   Not on file  Social History Narrative   Not on file   Social Drivers of Health   Financial Resource Strain: Not on file  Food Insecurity: Not on file  Transportation Needs: Not on file  Physical Activity: Not on file  Stress: Not on file  Social Connections: Not on file  Intimate Partner Violence: Not on file   Health  Maintenance  Topic Date Due   HIV Screening  Never done   Hepatitis C Screening  Never done   COVID-19 Vaccine (1 - 2024-25 season) Never done   Flu Shot  06/07/2023*   Pneumococcal Vaccination (1 of 2 - PCV) 05/20/2024*   DTaP/Tdap/Td vaccine (2 - Td or Tdap) 05/30/2031   HPV Vaccine  Aged Out  *Topic was postponed. The date shown is not the original due date.    Objective    BP 125/71   Pulse 74   Ht 6\' 2"  (1.88 m)   Wt 248 lb 6.4 oz (112.7 kg)   SpO2 97%   BMI 31.89 kg/m   Physical Exam Vitals reviewed.  Constitutional:      Appearance: He is obese.  HENT:     Head: Normocephalic.     Right Ear: Tympanic membrane and external ear normal.     Left Ear: Tympanic membrane and external ear normal.     Nose: Nose normal.  Eyes:     Extraocular Movements: Extraocular movements intact.     Pupils: Pupils are equal, round, and reactive to light.  Cardiovascular:     Rate and Rhythm: Normal rate and regular rhythm.  Pulmonary:  Effort: Pulmonary effort is normal.     Breath sounds: Normal breath sounds.  Abdominal:     General: Bowel sounds are normal.     Palpations: Abdomen is soft.  Musculoskeletal:        General: Normal range of motion.  Skin:    General: Skin is warm and dry.  Neurological:     Mental Status: He is oriented to person, place, and time.  Psychiatric:        Mood and Affect: Mood normal.        Behavior: Behavior normal.        Thought Content: Thought content normal.        Judgment: Judgment normal.    Assessment & Plan:  Juliocesar was seen today for new patient (initial visit).  Diagnoses and all orders for this visit:  Encounter to establish care -     CBC with Differential/Platelet -     CMP14+EGFR  Need for hepatitis C screening test -     HCV Ab w Reflex to Quant PCR  Screening for HIV (human immunodeficiency virus) -     HIV Antibody (routine testing w rflx)  Lipid screening -     Lipid panel  Tobacco abuse - I have  recommended complete cessation of tobacco use. I have discussed various options available for assistance with tobacco cessation including over the counter methods (Nicotine gum, patch and lozenges). We also discussed prescription options (Chantix, Nicotine Inhaler / Nasal Spray). The patient is not interested in pursuing any prescription tobacco cessation options at this time. - Patient declines at this time.  - Less than 5 minutes spent on counseling.   Diabetes mellitus screening -     Hemoglobin A1c  Vitamin D deficiency -     VITAMIN D 25 Hydroxy (Vit-D Deficiency, Fractures)  Class 1 obesity due to excess calories without serious comorbidity with body mass index (BMI) of 31.0 to 31.9 in adult Obesity is 30-39 indicating an excess in caloric intake or underlining conditions. This may lead to other co-morbidities. Educated on lifestyle modifications of diet and exercise which may reduce obesity.     Follow-up:  Return for annual physical.  The above assessment and management plan was discussed with the patient. The patient verbalized understanding of and has agreed to the management plan. Patient is aware to call the clinic if symptoms fail to improve or worsen. Patient is aware when to return to the clinic for a follow-up visit. Patient educated on when it is appropriate to go to the emergency department.   Gwinda Passe, NP-C

## 2023-05-22 LAB — HEMOGLOBIN A1C
Est. average glucose Bld gHb Est-mCnc: 114 mg/dL
Hgb A1c MFr Bld: 5.6 % (ref 4.8–5.6)

## 2023-05-22 LAB — CMP14+EGFR
ALT: 20 IU/L (ref 0–44)
AST: 21 IU/L (ref 0–40)
Albumin: 4.5 g/dL (ref 4.1–5.1)
Alkaline Phosphatase: 84 IU/L (ref 44–121)
BUN/Creatinine Ratio: 12 (ref 9–20)
BUN: 10 mg/dL (ref 6–20)
Bilirubin Total: 0.3 mg/dL (ref 0.0–1.2)
CO2: 22 mmol/L (ref 20–29)
Calcium: 9.2 mg/dL (ref 8.7–10.2)
Chloride: 106 mmol/L (ref 96–106)
Creatinine, Ser: 0.81 mg/dL (ref 0.76–1.27)
Globulin, Total: 2.6 g/dL (ref 1.5–4.5)
Glucose: 95 mg/dL (ref 70–99)
Potassium: 4.3 mmol/L (ref 3.5–5.2)
Sodium: 141 mmol/L (ref 134–144)
Total Protein: 7.1 g/dL (ref 6.0–8.5)
eGFR: 119 mL/min/{1.73_m2} (ref >59)

## 2023-05-22 LAB — CBC WITH DIFFERENTIAL/PLATELET
Basophils Absolute: 0 10*3/uL (ref 0.0–0.2)
Basos: 1 %
EOS (ABSOLUTE): 0.4 10*3/uL (ref 0.0–0.4)
Eos: 7 %
Hematocrit: 43.2 % (ref 37.5–51.0)
Hemoglobin: 14.5 g/dL (ref 13.0–17.7)
Immature Grans (Abs): 0 10*3/uL (ref 0.0–0.1)
Immature Granulocytes: 0 %
Lymphocytes Absolute: 1.4 10*3/uL (ref 0.7–3.1)
Lymphs: 25 %
MCH: 28.4 pg (ref 26.6–33.0)
MCHC: 33.6 g/dL (ref 31.5–35.7)
MCV: 85 fL (ref 79–97)
Monocytes Absolute: 0.5 10*3/uL (ref 0.1–0.9)
Monocytes: 9 %
Neutrophils Absolute: 3.3 10*3/uL (ref 1.4–7.0)
Neutrophils: 58 %
Platelets: 212 10*3/uL (ref 150–450)
RBC: 5.11 x10E6/uL (ref 4.14–5.80)
RDW: 12.6 % (ref 11.6–15.4)
WBC: 5.7 10*3/uL (ref 3.4–10.8)

## 2023-05-22 LAB — HCV AB W REFLEX TO QUANT PCR: HCV Ab: NONREACTIVE

## 2023-05-22 LAB — LIPID PANEL
Chol/HDL Ratio: 3.6 ratio (ref 0.0–5.0)
Cholesterol, Total: 160 mg/dL (ref 100–199)
HDL: 45 mg/dL (ref >39)
LDL Chol Calc (NIH): 105 mg/dL — ABNORMAL HIGH (ref 0–99)
Triglycerides: 50 mg/dL (ref 0–149)
VLDL Cholesterol Cal: 10 mg/dL (ref 5–40)

## 2023-05-22 LAB — HCV INTERPRETATION

## 2023-05-22 LAB — VITAMIN D 25 HYDROXY (VIT D DEFICIENCY, FRACTURES): Vit D, 25-Hydroxy: 9 ng/mL — ABNORMAL LOW (ref 30.0–100.0)

## 2023-05-22 LAB — HIV ANTIBODY (ROUTINE TESTING W REFLEX): HIV Screen 4th Generation wRfx: NONREACTIVE

## 2023-05-24 ENCOUNTER — Other Ambulatory Visit (INDEPENDENT_AMBULATORY_CARE_PROVIDER_SITE_OTHER): Payer: Self-pay

## 2023-05-24 MED ORDER — VITAMIN D (ERGOCALCIFEROL) 1.25 MG (50000 UNIT) PO CAPS: 50000.0000 [IU] | ORAL_CAPSULE | ORAL | 0 refills | Status: DC

## 2023-05-27 ENCOUNTER — Other Ambulatory Visit (INDEPENDENT_AMBULATORY_CARE_PROVIDER_SITE_OTHER): Payer: Self-pay

## 2023-05-27 ENCOUNTER — Other Ambulatory Visit (HOSPITAL_COMMUNITY): Payer: Self-pay

## 2023-05-27 MED ORDER — VITAMIN D (ERGOCALCIFEROL) 1.25 MG (50000 UNIT) PO CAPS
50000.0000 [IU] | ORAL_CAPSULE | ORAL | 0 refills | Status: AC
  Filled 2023-05-27: qty 10, 70d supply, fill #0

## 2023-06-22 ENCOUNTER — Other Ambulatory Visit: Payer: Self-pay

## 2023-06-22 ENCOUNTER — Emergency Department (HOSPITAL_BASED_OUTPATIENT_CLINIC_OR_DEPARTMENT_OTHER)
Admission: EM | Admit: 2023-06-22 | Discharge: 2023-06-22 | Discharge status: Against Medical Advice | Payer: Self-pay | Attending: Emergency Medicine | Admitting: Emergency Medicine

## 2023-06-22 ENCOUNTER — Encounter (HOSPITAL_BASED_OUTPATIENT_CLINIC_OR_DEPARTMENT_OTHER): Payer: Self-pay

## 2023-06-22 DIAGNOSIS — Z5321 Procedure and treatment not carried out due to patient leaving prior to being seen by health care provider: Secondary | ICD-10-CM | POA: Insufficient documentation

## 2023-06-22 DIAGNOSIS — K0889 Other specified disorders of teeth and supporting structures: Secondary | ICD-10-CM | POA: Insufficient documentation

## 2023-06-22 NOTE — ED Triage Notes (Signed)
 Left lower molar dental pain for "months"    now swollen left side of gum/face

## 2023-06-22 NOTE — ED Notes (Signed)
 Pt not found in waiting room and/or surrounding areas x 3 attempts.

## 2023-10-17 ENCOUNTER — Emergency Department (HOSPITAL_COMMUNITY)
Admission: EM | Admit: 2023-10-17 | Discharge: 2023-10-17 | Disposition: A | Discharge status: Home / Self Care | Payer: Self-pay | Attending: Emergency Medicine | Admitting: Emergency Medicine

## 2023-10-17 DIAGNOSIS — T63441A Toxic effect of venom of bees, accidental (unintentional), initial encounter: Secondary | ICD-10-CM | POA: Insufficient documentation

## 2023-10-17 MED ORDER — CETIRIZINE HCL 5 MG/5ML PO SOLN
10.0000 mg | Freq: Once | ORAL | Status: AC
  Administered 2023-10-17: 10 mg via ORAL
  Filled 2023-10-17: qty 10

## 2023-10-17 MED ORDER — DEXAMETHASONE SODIUM PHOSPHATE 10 MG/ML IJ SOLN
10.0000 mg | Freq: Once | INTRAMUSCULAR | Status: AC
  Administered 2023-10-17: 10 mg via INTRAMUSCULAR
  Filled 2023-10-17: qty 1

## 2023-10-17 NOTE — ED Provider Notes (Signed)
 [facility] Emergency Department Provider Note MRN:  [id number]  Arrival date & time: 10/17/23     Chief Complaint   Insect Bite   History of Present Illness   William Porter is a 33 y.o. year-old male presents to the ED with chief complaint of bee sting.  States that he was stung by bee on his left lower eyelid.  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  He states that the sting occurred around 8 PM.  He denies any treatments prior to arrival.  He states that he still feels some throbbing and is uncertain if the stinger is still present.  He denies any wheezing.  Denies shortness of breath.  Denies nausea, vomiting, or diarrhea..  History provided by patient.   Review of Systems  Pertinent positive and negative review of systems noted in HPI.    Physical Exam   Vitals:   10/17/23 2245  BP: (!) 144/97  Pulse: 88  Resp: 18  Temp: 98.1 F (36.7 C)  SpO2: 98%    CONSTITUTIONAL:  non toxic-appearing, NAD NEURO:  Alert and oriented x 3, CN 3-12 grossly intact EYES:  eyes equal and reactive, there is a very mild amount of swelling of the left lower eyelid with a very small erythematous mark, but now stinger present ENT/NECK:  Supple, no stridor  CARDIO:  normal rate, regular rhythm, appears well-perfused  PULM:  No respiratory distress, CTAB GI/GU:  non-distended,  MSK/SPINE:  No gross deformities, no edema, moves all extremities  SKIN:  no rash, atraumatic   *Additional and/or pertinent findings included in MDM below  Diagnostic and Interventional Summary    EKG Interpretation Date/Time:    Ventricular Rate:    PR Interval:    QRS Duration:    QT Interval:    QTC Calculation:   R Axis:      Text Interpretation:         Labs Reviewed - No data to display  No orders to display    Medications  dexamethasone  (DECADRON ) injection 10 mg (has no administration in time range)  cetirizine  HCl (Zyrtec ) 5 MG/5ML solution 10 mg (has no  administration in time range)     Procedures  /  Critical Care Procedures  ED Course and Medical Decision Making  I have reviewed the triage vital signs, the nursing notes, and pertinent available records from the EMR.  Social Determinants Affecting Complexity of Care: Patient has no clinically significant social determinants affecting this chief complaint..   ED Course:    Medical Decision Making Patient here with bee sting to the left lower eyelid.  The sting occurred about 3 hours ago.  There is no evidence of anaphylaxis.  No wheezing, vomiting, diarrhea.  No rash.    No evidence of retained stinger.  Will give shot of decadron  and zyrtec .    DC home.  Risk Prescription drug management.         Consultants: No consultations were needed in caring for this patient.   Treatment and Plan: Emergency department workup does not suggest an emergent condition requiring admission or immediate intervention beyond  what has been performed at this time. The patient is safe for discharge and has  been instructed to return immediately for worsening symptoms, change in  symptoms or any other concerns    Final Clinical Impressions(s) / ED Diagnoses     ICD-10-CM   1. Bee sting, accidental or unintentional, initial encounter  T63.441A  ED Discharge Orders     None         Discharge Instructions Discussed with and Provided to Patient:   Discharge Instructions   None      Vicky Charleston, PA-C 10/17/23 2317    Jerral Meth, MD 10/17/23 [phone]

## 2023-10-17 NOTE — ED Triage Notes (Incomplete)
 Patient arrived with complaints of a bee sting to the left eye tonight around 7pm. Some swelling noted and painful when he closes his eye.

## 2023-11-11 ENCOUNTER — Encounter (HOSPITAL_COMMUNITY): Payer: Self-pay | Admitting: *Deleted

## 2023-11-11 ENCOUNTER — Other Ambulatory Visit: Payer: Self-pay

## 2023-11-11 ENCOUNTER — Other Ambulatory Visit (HOSPITAL_COMMUNITY): Payer: Self-pay

## 2023-11-11 ENCOUNTER — Emergency Department (HOSPITAL_COMMUNITY)
Admission: EM | Admit: 2023-11-11 | Discharge: 2023-11-11 | Disposition: A | Discharge status: Home / Self Care | Payer: Self-pay | Attending: Emergency Medicine | Admitting: Emergency Medicine

## 2023-11-11 DIAGNOSIS — R030 Elevated blood-pressure reading, without diagnosis of hypertension: Secondary | ICD-10-CM | POA: Insufficient documentation

## 2023-11-11 DIAGNOSIS — K029 Dental caries, unspecified: Secondary | ICD-10-CM | POA: Insufficient documentation

## 2023-11-11 DIAGNOSIS — K047 Periapical abscess without sinus: Secondary | ICD-10-CM | POA: Insufficient documentation

## 2023-11-11 MED ORDER — PENICILLIN V POTASSIUM 500 MG PO TABS
500.0000 mg | ORAL_TABLET | Freq: Once | ORAL | Status: AC
  Administered 2023-11-11: 500 mg via ORAL
  Filled 2023-11-11: qty 1

## 2023-11-11 MED ORDER — HYDROCODONE-ACETAMINOPHEN 5-325 MG PO TABS
1.0000 | ORAL_TABLET | Freq: Once | ORAL | Status: AC
  Administered 2023-11-11: 1 via ORAL
  Filled 2023-11-11: qty 1

## 2023-11-11 MED ORDER — PENICILLIN V POTASSIUM 500 MG PO TABS
500.0000 mg | ORAL_TABLET | Freq: Four times a day (QID) | ORAL | 0 refills | Status: AC
  Filled 2023-11-11 (×2): qty 40, 10d supply, fill #0

## 2023-11-11 NOTE — ED Provider Notes (Signed)
 [place] EMERGENCY DEPARTMENT AT [facility] Provider Note   CSN: [id number] Arrival date & time: 11/11/23  0501     Patient presents with: Dental Pain   William Porter is a 33 y.o. male.   33 year old male presents with complaint of right lower dental pain onset 2 days ago.  Recurrent in nature without trauma, fever, drainage.  Feels like he started to have some swelling to the right side of his face with worsening pain which prompted him to come into today.  Reports taking ibuprofen  2 hours prior to arrival without relief.       Prior to Admission medications   Medication Sig Start Date End Date Taking? Authorizing Provider  penicillin  v potassium (VEETID) 500 MG tablet Take 1 tablet (500 mg total) by mouth 4 (four) times daily for 10 days. 11/11/23 11/21/23 Yes Beverley Leita LABOR, PA-C  ibuprofen  (ADVIL ) 600 MG tablet Take 1 tablet (600 mg total) by mouth every 6 (six) hours as needed. Patient not taking: Reported on 05/21/2023 05/29/21   Desiderio Chew, PA-C  Vitamin D , Ergocalciferol , (DRISDOL ) 1.25 MG (50000 UNIT) CAPS capsule Take 1 capsule (50,000 Units total) by mouth every 7 (seven) days. 05/27/23   Celestia Rosaline SQUIBB, NP    Allergies: Bee venom    Review of Systems Negative except as per HPI Updated Vital Signs BP (!) 167/112   Pulse 65   Temp 98.2 F (36.8 C) (Oral)   Resp 20   SpO2 99%   Physical Exam Vitals and nursing note reviewed.  Constitutional:      General: He is not in acute distress.    Appearance: He is well-developed. He is not diaphoretic.  HENT:     Head: Normocephalic and atraumatic.     Jaw: No trismus.     Nose: Nose normal.     Mouth/Throat:     Mouth: Mucous membranes are moist.      Comments: No submandibular or sublingual swelling, tongue midline Eyes:     Conjunctiva/sclera: Conjunctivae normal.  Cardiovascular:     Rate and Rhythm: Normal rate and regular rhythm.     Heart sounds: Normal heart sounds. No murmur  heard. Pulmonary:     Effort: Pulmonary effort is normal. No respiratory distress.     Breath sounds: Normal breath sounds.  Skin:    General: Skin is warm and dry.     Capillary Refill: Capillary refill takes less than 2 seconds.     Findings: No rash.  Neurological:     Mental Status: He is alert and oriented to person, place, and time.  Psychiatric:        Behavior: Behavior normal.     (all labs ordered are listed, but only abnormal results are displayed) Labs Reviewed - No data to display  EKG: None  Radiology: No results found.   Procedures   Medications Ordered in the ED  penicillin  v potassium (VEETID) tablet 500 mg (500 mg Oral Given 11/11/23 0526)  HYDROcodone -acetaminophen  (NORCO/VICODIN) 5-325 MG per tablet 1 tablet (1 tablet Oral Given 11/11/23 0526)                                    Medical Decision Making  33 year old male with complaint of right lower dental pain with concern for local swelling without drainage, fever, trauma.  Found to have right lower dental carry, likely developing abscess.  Decay of  the tooth to the gumline.  Provided with antibiotics.  Prescription for same.  Also given Norco for pain as he arrives with fairly elevated blood pressure, likely secondary to pain.  On recheck, blood pressure is gradually improving, currently 167/112.  Review of recent visits to urgent care and ER, blood pressure generally 140s over 80s.  Advised to monitor blood pressure, record results and follow-up with PCP.  Provided with dental resources for follow-up.     Final diagnoses:  Dental caries  Dental abscess  Elevated blood pressure reading    ED Discharge Orders          Ordered    penicillin  v potassium (VEETID) 500 MG tablet  4 times daily        11/11/23 0510               Beverley Leita LABOR, PA-C 11/11/23 MAR[place] Jerral Meth, MD 11/11/23 [phone]

## 2023-11-11 NOTE — ED Triage Notes (Signed)
 Right lower dental pain x 2 days, last took 600 mg ibuprofen  2 hours ago.

## 2023-11-11 NOTE — Discharge Instructions (Addendum)
 Antibiotics as prescribed. Rinse with Listerine after every meal. Continue to brush with a soft toothbrush. See a dentist as soon as possible.   Monitor your blood pressure, record results and take to follow-up with your primary care provider in 2 days.

## 2023-11-12 ENCOUNTER — Other Ambulatory Visit (HOSPITAL_COMMUNITY): Payer: Self-pay

## 2024-02-10 ENCOUNTER — Other Ambulatory Visit: Payer: Self-pay

## 2024-02-10 ENCOUNTER — Emergency Department (HOSPITAL_COMMUNITY)
Admission: EM | Admit: 2024-02-10 | Discharge: 2024-02-11 | Disposition: A | Payer: Self-pay | Attending: Emergency Medicine | Admitting: Emergency Medicine

## 2024-02-10 DIAGNOSIS — K0889 Other specified disorders of teeth and supporting structures: Secondary | ICD-10-CM | POA: Insufficient documentation

## 2024-02-10 DIAGNOSIS — M542 Cervicalgia: Secondary | ICD-10-CM | POA: Insufficient documentation

## 2024-02-10 DIAGNOSIS — Z5329 Procedure and treatment not carried out because of patient's decision for other reasons: Secondary | ICD-10-CM | POA: Insufficient documentation

## 2024-02-10 NOTE — ED Triage Notes (Signed)
 Patient c/o dental pain tonight. Patient report taking Ibuprofen  with no relief. Patient denies fever at home. Patient report he got dental appointment on 12/13.

## 2024-02-11 ENCOUNTER — Encounter (HOSPITAL_COMMUNITY): Payer: Self-pay

## 2024-02-11 MED ORDER — METHOCARBAMOL 500 MG PO TABS
750.0000 mg | ORAL_TABLET | Freq: Once | ORAL | Status: AC
  Administered 2024-02-11: 750 mg via ORAL
  Filled 2024-02-11: qty 2

## 2024-02-11 MED ORDER — ACETAMINOPHEN 500 MG PO TABS
1000.0000 mg | ORAL_TABLET | Freq: Once | ORAL | Status: AC
  Administered 2024-02-11: 1000 mg via ORAL
  Filled 2024-02-11: qty 2

## 2024-02-11 MED ORDER — LIDOCAINE VISCOUS HCL 2 % MT SOLN
15.0000 mL | Freq: Once | OROMUCOSAL | Status: AC
  Administered 2024-02-11: 15 mL via OROMUCOSAL
  Filled 2024-02-11: qty 15

## 2024-02-11 NOTE — ED Provider Notes (Signed)
 [place] EMERGENCY DEPARTMENT AT [facility] Provider Note  CSN: [id number] Arrival date & time: 02/10/24 2317  Chief Complaint(s) Dental Pain  HPI William Porter is a 33 y.o. male patient presented with left lower and upper dental pain.  Came on around 9 PM.  Aching in nature radiating to the ear.  Reports similar episodes in the past related to poor dentition.  No fall or trauma.  No infectious symptoms.  No other physical complaints   Dental Pain   Past Medical History Past Medical History:  Diagnosis Date   No pertinent past medical history    There are no active problems to display for this patient.  Home Medication(s) Prior to Admission medications   Medication Sig Start Date End Date Taking? Authorizing Provider  ibuprofen  (ADVIL ) 600 MG tablet Take 1 tablet (600 mg total) by mouth every 6 (six) hours as needed. Patient not taking: Reported on 05/21/2023 05/29/21   Desiderio Chew, PA-C  Vitamin D , Ergocalciferol , (DRISDOL ) 1.25 MG (50000 UNIT) CAPS capsule Take 1 capsule (50,000 Units total) by mouth every 7 (seven) days. 05/27/23   Celestia Rosaline SQUIBB, NP                                                                                                                                    Allergies Bee venom  Review of Systems Review of Systems As noted in HPI  Physical Exam Vital Signs  I have reviewed the triage vital signs BP (!) 148/104 (BP Location: Right Arm)   Pulse 64   Temp 98.4 F (36.9 C) (Oral)   Resp 20   Ht 6' 2 (1.88 m)   Wt 116.1 kg   SpO2 97%   BMI 32.86 kg/m   Physical Exam Vitals reviewed.  Constitutional:      General: He is not in acute distress.    Appearance: He is well-developed. He is not diaphoretic.  HENT:     Head: Normocephalic and atraumatic.     Right Ear: External ear normal.     Left Ear: External ear normal.     Nose: Nose normal.     Mouth/Throat:     Mouth: Mucous membranes are moist.     Dentition:  Abnormal dentition. Dental tenderness present. No dental abscesses.     Tonsils: No tonsillar exudate.  Eyes:     General: No scleral icterus.    Conjunctiva/sclera: Conjunctivae normal.  Neck:     Trachea: Phonation normal.   Cardiovascular:     Rate and Rhythm: Normal rate and regular rhythm.  Pulmonary:     Effort: Pulmonary effort is normal. No respiratory distress.     Breath sounds: No stridor.  Abdominal:     General: There is no distension.  Musculoskeletal:        General: Normal range of motion.     Cervical back: Normal range of motion. Muscular tenderness  present.  Neurological:     Mental Status: He is alert and oriented to person, place, and time.  Psychiatric:        Behavior: Behavior normal.     ED Results and Treatments Labs (all labs ordered are listed, but only abnormal results are displayed) Labs Reviewed - No data to display                                                                                                                       EKG  EKG Interpretation Date/Time:    Ventricular Rate:    PR Interval:    QRS Duration:    QT Interval:    QTC Calculation:   R Axis:      Text Interpretation:         Radiology No results found.  Medications Ordered in ED Medications  acetaminophen  (TYLENOL ) tablet 1,000 mg (1,000 mg Oral Given 02/11/24 0057)  lidocaine  (XYLOCAINE ) 2 % viscous mouth solution 15 mL (15 mLs Mouth/Throat Given 02/11/24 0057)  methocarbamol  (ROBAXIN ) tablet 750 mg (750 mg Oral Given 02/11/24 0057)   Procedures Procedures  (including critical care time) Medical Decision Making / ED Course   Medical Decision Making Risk OTC drugs. Prescription drug management.    Dental pain.  No obvious signs of abscess requiring drainage.  No evidence of Ludwig's or other deep tissue infection requiring labs or imaging at this time.  No otitis media.  Patient does have tenderness to palpation along proximal sternocleidomastoid/upper  trapezius muscles.  Likely strain/spasm.  Doubt acute arterial process.  Patient given above medication.  He eloped prior to reassessment    Final Clinical Impression(s) / ED Diagnoses Final diagnoses:  Pain, dental   This chart was dictated using voice recognition software.  Despite best efforts to proofread,  errors can occur which can change the documentation meaning.    Trine Raynell Moder, MD 02/11/24 [phone]
